# Patient Record
Sex: Male | Born: 1970
Health system: Southern US, Community
[De-identification: ages and names within clinical notes are randomized; demographics above are authoritative.]

## PROBLEM LIST (undated history)

## (undated) DIAGNOSIS — E785 Hyperlipidemia, unspecified: Secondary | ICD-10-CM

## (undated) DIAGNOSIS — I10 Essential (primary) hypertension: Secondary | ICD-10-CM

## (undated) DIAGNOSIS — N189 Chronic kidney disease, unspecified: Secondary | ICD-10-CM

## (undated) DIAGNOSIS — I499 Cardiac arrhythmia, unspecified: Secondary | ICD-10-CM

## (undated) HISTORY — DX: Hyperlipidemia, unspecified: E78.5

## (undated) HISTORY — DX: Chronic kidney disease, unspecified: N18.9

## (undated) HISTORY — DX: Cardiac arrhythmia, unspecified: I49.9

## (undated) HISTORY — DX: Essential (primary) hypertension: I10

## (undated) HISTORY — PX: RETINAL DETACHMENT SURGERY: SHX105

---

## 2012-11-09 ENCOUNTER — Encounter: Payer: Self-pay | Admitting: Family Medicine

## 2012-11-09 ENCOUNTER — Ambulatory Visit (INDEPENDENT_AMBULATORY_CARE_PROVIDER_SITE_OTHER): Payer: BC Managed Care – PPO | Admitting: Family Medicine

## 2012-11-09 VITALS — BP 156/96 | HR 59 | Temp 98.7°F | Ht 74.0 in | Wt 326.7 lb

## 2012-11-09 DIAGNOSIS — K645 Perianal venous thrombosis: Secondary | ICD-10-CM

## 2012-11-09 DIAGNOSIS — K649 Unspecified hemorrhoids: Secondary | ICD-10-CM

## 2012-11-09 MED ORDER — HYDROCORTISONE ACETATE 25 MG RE SUPP: 25.0000 mg | Freq: Two times a day (BID) | RECTAL | Status: DC

## 2012-11-09 NOTE — Progress Notes (Signed)
  Subjective:    Patient ID: Ian Stewart, male    DOB: 14-Feb-1971, 42 y.o.   MRN: [id number]  HPI This 42 y.o. male presents for evaluation of severe rectal pain starting yesterday. Difficulty sleeping last night.  He has hx of hemorrhoids and states he usually takes Preparation H and then is able to reduce the hemorrhoid but not this one and it has grown Large.   Review of Systems C/o rectal pain. No chest pain, SOB, HA, dizziness, vision change, N/V, diarrhea, constipation, dysuria, urinary urgency or frequency, myalgias, arthralgias or rash.     Objective:   Physical Exam  Vital signs noted  Well developed well nourished male.  HEENT - Head atraumatic Normocephalic Respiratory - Lungs CTA bilateral Cardiac - RRR S1 and S2 without murmur Rectal - Large thrombosed external hemorrhoid.  Procedure - Hemorrhoid cleansed with betadine and anesthesia with lidocaine 1% with small wheal And then a small horizontal incision is drained and approx. 10 cc's of clot is expressed and patient Has immediate relief.      Assessment & Plan:  Hemorrhoids - Plan: hydrocortisone (ANUSOL-HC) 25 MG suppository, Ambulatory referral to General Surgery.  Incision and drainage of thrombosed hemorrhoid.  Discussed doing Sitz baths bid For next few days.  Thrombosed external hemorrhoid - Plan: hydrocortisone (ANUSOL-HC) 25 MG suppository, Ambulatory referral to General Surgery

## 2012-11-09 NOTE — Patient Instructions (Signed)
Hemorrhoids Hemorrhoids are swollen veins around the rectum or anus. There are two types of hemorrhoids:   Internal hemorrhoids. These occur in the veins just inside the rectum. They may poke through to the outside and become irritated and painful.  External hemorrhoids. These occur in the veins outside the anus and can be felt as a painful swelling or hard lump near the anus. CAUSES  Pregnancy.   Obesity.   Constipation or diarrhea.   Straining to have a bowel movement.   Sitting for long periods on the toilet.  Heavy lifting or other activity that caused you to strain.  Anal intercourse. SYMPTOMS   Pain.   Anal itching or irritation.   Rectal bleeding.   Fecal leakage.   Anal swelling.   One or more lumps around the anus.  DIAGNOSIS  Your caregiver may be able to diagnose hemorrhoids by visual examination. Other examinations or tests that may be performed include:   Examination of the rectal area with a gloved hand (digital rectal exam).   Examination of anal canal using a small tube (scope).   A blood test if you have lost a significant amount of blood.  A test to look inside the colon (sigmoidoscopy or colonoscopy). TREATMENT Most hemorrhoids can be treated at home. However, if symptoms do not seem to be getting better or if you have a lot of rectal bleeding, your caregiver may perform a procedure to help make the hemorrhoids get smaller or remove them completely. Possible treatments include:   Placing a rubber band at the base of the hemorrhoid to cut off the circulation (rubber band ligation).   Injecting a chemical to shrink the hemorrhoid (sclerotherapy).   Using a tool to burn the hemorrhoid (infrared light therapy).   Surgically removing the hemorrhoid (hemorrhoidectomy).   Stapling the hemorrhoid to block blood flow to the tissue (hemorrhoid stapling).  HOME CARE INSTRUCTIONS   Eat foods with fiber, such as whole grains, beans,  nuts, fruits, and vegetables. Ask your doctor about taking products with added fiber in them (fibersupplements).  Increase fluid intake. Drink enough water and fluids to keep your urine clear or pale yellow.   Exercise regularly.   Go to the bathroom when you have the urge to have a bowel movement. Do not wait.   Avoid straining to have bowel movements.   Keep the anal area dry and clean. Use wet toilet paper or moist towelettes after a bowel movement.   Medicated creams and suppositories may be used or applied as directed.   Only take over-the-counter or prescription medicines as directed by your caregiver.   Take warm sitz baths for 15 20 minutes, 3 4 times a day to ease pain and discomfort.   Place ice packs on the hemorrhoids if they are tender and swollen. Using ice packs between sitz baths may be helpful.   Put ice in a plastic bag.   Place a towel between your skin and the bag.   Leave the ice on for 15 20 minutes, 3 4 times a day.   Do not use a donut-shaped pillow or sit on the toilet for long periods. This increases blood pooling and pain.  SEEK MEDICAL CARE IF:  You have increasing pain and swelling that is not controlled by treatment or medicine.  You have uncontrolled bleeding.  You have difficulty or you are unable to have a bowel movement.  You have pain or inflammation outside the area of the hemorrhoids. MAKE SURE YOU:    Understand these instructions.  Will watch your condition.  Will get help right away if you are not doing well or get worse. Document Released: 02/22/2000 Document Revised: 02/11/2012 Document Reviewed: 12/30/2011 Exit[employer] Patient Information 2014 [employer].  

## 2012-11-10 ENCOUNTER — Telehealth: Payer: Self-pay | Admitting: Family Medicine

## 2012-11-11 NOTE — Telephone Encounter (Signed)
Patient aware used suppositories and it has made bp go up so he has stopped but the bleeding is getting better

## 2012-11-11 NOTE — Telephone Encounter (Signed)
Bleeding is going to happen for next few days and is ok since he has thrombosed hemorrhoid and continue sitz bath an follow up with surgery

## 2012-11-12 NOTE — Telephone Encounter (Signed)
It is normal to have bleeding for a few days after having the clots removed from thrombosed hemorrhoid

## 2012-11-22 ENCOUNTER — Ambulatory Visit (INDEPENDENT_AMBULATORY_CARE_PROVIDER_SITE_OTHER): Payer: BC Managed Care – PPO | Admitting: Surgery

## 2013-12-15 ENCOUNTER — Ambulatory Visit (INDEPENDENT_AMBULATORY_CARE_PROVIDER_SITE_OTHER): Payer: BC Managed Care – PPO | Admitting: Nurse Practitioner

## 2013-12-15 ENCOUNTER — Encounter: Payer: Self-pay | Admitting: Nurse Practitioner

## 2013-12-15 VITALS — BP 158/105 | HR 55 | Temp 98.0°F | Ht 74.0 in | Wt 253.0 lb

## 2013-12-15 DIAGNOSIS — I10 Essential (primary) hypertension: Secondary | ICD-10-CM

## 2013-12-15 DIAGNOSIS — Z6832 Body mass index (BMI) 32.0-32.9, adult: Secondary | ICD-10-CM

## 2013-12-15 DIAGNOSIS — E785 Hyperlipidemia, unspecified: Secondary | ICD-10-CM

## 2013-12-15 MED ORDER — NEBIVOLOL HCL 10 MG PO TABS: 10.0000 mg | ORAL_TABLET | Freq: Every day | ORAL | Status: DC

## 2013-12-15 NOTE — Patient Instructions (Signed)

## 2013-12-15 NOTE — Progress Notes (Signed)
Subjective:    Patient ID: Ian Stewart, male    DOB: 1970-11-05, 43 y.o.   MRN: [id number]  Patient here today for follow up of chronic medical problems. He  Had a migraine over the weekend and had to go to ER and blood pressure was really high- He stopped taking his bystolic because he had lost a lot of weight and was told to stop taking- Blood pressure has been good up until the last several weeks   Hypertension This is a chronic problem. The current episode started more than 1 year ago. The problem is unchanged. The problem is controlled. Pertinent negatives include no blurred vision, headaches, palpitations, peripheral edema or shortness of breath. Risk factors for coronary artery disease include stress and dyslipidemia. Past treatments include angiotensin blockers, calcium channel blockers and diuretics. The current treatment provides mild improvement. Compliance problems include diet and exercise.   Hyperlipidemia This is a chronic problem. The current episode started more than 1 year ago. The problem is uncontrolled. Recent lipid tests were reviewed and are high. He has no history of diabetes, hypothyroidism or obesity. There are no known factors aggravating his hyperlipidemia. Pertinent negatives include no shortness of breath. He is currently on no antihyperlipidemic treatment (was taken off medds because he had lost weight nad labs were good.). The current treatment provides moderate improvement of lipids. Compliance problems include adherence to diet and adherence to exercise.  Risk factors for coronary artery disease include dyslipidemia and hypertension.    * He had labs drawn at work yesterday  Review of Systems  Eyes: Negative for blurred vision.  Respiratory: Negative for shortness of breath.   Cardiovascular: Negative for palpitations.  Neurological: Negative for headaches.       Objective:   Physical Exam  Constitutional: He is oriented to person, place, and time. He  appears well-developed and well-nourished.  HENT:  Head: Normocephalic.  Right Ear: External ear normal.  Left Ear: External ear normal.  Nose: Nose normal.  Mouth/Throat: Oropharynx is clear and moist.  Eyes: EOM are normal. Pupils are equal, round, and reactive to light.  Neck: Normal range of motion. Neck supple. No JVD present. No thyromegaly present.  Cardiovascular: Normal rate, regular rhythm, normal heart sounds and intact distal pulses.  Exam reveals no gallop and no friction rub.   No murmur heard. Pulmonary/Chest: Effort normal and breath sounds normal. No respiratory distress. He has no wheezes. He has no rales. He exhibits no tenderness.  Abdominal: Soft. Bowel sounds are normal. He exhibits no mass. There is no tenderness.  Musculoskeletal: Normal range of motion. He exhibits no edema.  Lymphadenopathy:    He has no cervical adenopathy.  Neurological: He is alert and oriented to person, place, and time. No cranial nerve deficit.  Skin: Skin is warm and dry.  Psychiatric: He has a normal mood and affect. His behavior is normal. Judgment and thought content normal.   BP 158/105  Pulse 55  Temp(Src) 98 F (36.7 C) (Oral)  Ht 6\' 2"  (1.88 m)  Wt 253 lb (114.76 kg)  BMI 32.47 kg/m2        Assessment & Plan:  1. Essential hypertension Avoid salt in diet Add 1/2 bystolicback to meds Keep diary of blood pressure - nebivolol (BYSTOLIC) 10 MG tablet; Take 1 tablet (10 mg total) by mouth daily.  Dispense: 90 tablet; Refill: 1  2. Hyperlipidemia with target LDL less than 100 Continue low fat diet and exercise  3. BMI 32.0-32.9 Discussed  diet and exercise for person with BMI >25 Will recheck weight in 3-6 months   Labs pending- patient to have copy of blood work sent to office. Health maintenance reviewed Continue all meds Follow up  In 3 months    [place], FNP

## 2013-12-16 ENCOUNTER — Telehealth: Payer: Self-pay | Admitting: Nurse Practitioner

## 2013-12-16 NOTE — Telephone Encounter (Signed)
Left message with this information and to call back if he has further questions or concerns.

## 2013-12-16 NOTE — Telephone Encounter (Signed)
Patient thought you mentioned hearing a slight murmur on his recent exam and after talking with a friend he wanted to know if he should follow up on this.

## 2013-12-16 NOTE — Telephone Encounter (Signed)
Very faint and nothing to worry about

## 2014-03-08 ENCOUNTER — Ambulatory Visit (INDEPENDENT_AMBULATORY_CARE_PROVIDER_SITE_OTHER): Payer: BC Managed Care – PPO | Admitting: Physician Assistant

## 2014-03-08 ENCOUNTER — Encounter: Payer: Self-pay | Admitting: Physician Assistant

## 2014-03-08 VITALS — BP 168/104 | HR 53 | Temp 98.4°F | Ht 74.0 in | Wt 266.0 lb

## 2014-03-08 DIAGNOSIS — I10 Essential (primary) hypertension: Secondary | ICD-10-CM

## 2014-03-08 NOTE — Progress Notes (Signed)
Subjective:     Patient ID: Ian Stewart, male   DOB: [birth year], 43 y.o.   MRN: [id number]  HPI Pt with general malaise/joint pain and fatigue Had some sl diarrhea today He has been under a lot of stress since Xmas His mother came to town for the holiday and ended having a MI with stenting He has just gotten her set up back at home when the sx started Some of the sx include cramping  Review of Systems  Constitutional: Positive for fever, chills, activity change, appetite change and fatigue.  HENT: Negative for congestion, postnasal drip, rhinorrhea, sinus pressure, sneezing, sore throat and tinnitus.   Gastrointestinal: Positive for diarrhea. Negative for nausea, vomiting and abdominal pain.  Musculoskeletal: Positive for myalgias and arthralgias.       Objective:   Physical Exam  Constitutional: He appears well-developed and well-nourished.  HENT:  Right Ear: External ear normal.  Left Ear: External ear normal.  Mouth/Throat: Oropharynx is clear and moist. No oropharyngeal exudate.  Neck: Neck supple. No JVD present.  Cardiovascular: Normal rate, regular rhythm and normal heart sounds.   Pulmonary/Chest: Effort normal and breath sounds normal.  Abdominal: Soft. Bowel sounds are normal. He exhibits no distension and no mass. There is no tenderness. There is no rebound and no guarding.  Musculoskeletal:  No lower ext edema + varicosities bilat  Nursing note and vitals reviewed.      Assessment:     General malaise    Plan:     Discussed with the pt i think most of the sx are coming from a viral illness Since he is having some cramping and on a diuretic will check a BMP Rest Fluids Bland diet F/U prn/pending labs

## 2014-03-08 NOTE — Patient Instructions (Signed)

## 2014-03-09 ENCOUNTER — Telehealth: Payer: Self-pay | Admitting: Physician Assistant

## 2014-03-09 LAB — BMP8+EGFR
BUN/Creatinine Ratio: 20 (ref 9–20)
BUN: 14 mg/dL (ref 6–24)
CALCIUM: 9.7 mg/dL (ref 8.7–10.2)
CO2: 26 mmol/L (ref 18–29)
CREATININE: 0.7 mg/dL — AB (ref 0.76–1.27)
Chloride: 98 mmol/L (ref 97–108)
GFR, EST AFRICAN AMERICAN: 132 mL/min/{1.73_m2} (ref >59)
GFR, EST NON AFRICAN AMERICAN: 114 mL/min/{1.73_m2} (ref >59)
GLUCOSE: 90 mg/dL (ref 65–99)
Potassium: 3.5 mmol/L (ref 3.5–5.2)
Sodium: 140 mmol/L (ref 134–144)

## 2014-03-09 NOTE — Telephone Encounter (Signed)
Aware, labs back but provider has not reviewed.

## 2014-03-20 ENCOUNTER — Other Ambulatory Visit: Payer: Self-pay | Admitting: *Deleted

## 2014-03-20 ENCOUNTER — Encounter: Payer: Self-pay | Admitting: Nurse Practitioner

## 2014-03-20 ENCOUNTER — Ambulatory Visit (INDEPENDENT_AMBULATORY_CARE_PROVIDER_SITE_OTHER): Payer: BLUE CROSS/BLUE SHIELD | Admitting: Nurse Practitioner

## 2014-03-20 VITALS — BP 158/107 | HR 56 | Temp 97.7°F | Ht 74.0 in | Wt 272.0 lb

## 2014-03-20 DIAGNOSIS — I10 Essential (primary) hypertension: Secondary | ICD-10-CM

## 2014-03-20 DIAGNOSIS — R001 Bradycardia, unspecified: Secondary | ICD-10-CM

## 2014-03-20 MED ORDER — AMLODIPINE BESYLATE 10 MG PO TABS: 10.0000 mg | ORAL_TABLET | Freq: Every day | ORAL | Status: DC

## 2014-03-20 NOTE — Patient Instructions (Signed)
Bradycardia °Bradycardia is a term for a heart rate (pulse) that, in adults, is slower than 60 beats per minute. A normal rate is 60 to 100 beats per minute. A heart rate below 60 beats per minute may be normal for some adults with healthy hearts. If the rate is too slow, the heart may have trouble pumping the volume of blood the body needs. If the heart rate gets too low, blood flow to the brain may be decreased and may make you feel lightheaded, dizzy, or faint. °The heart has a natural pacemaker in the top of the heart called the SA node (sinoatrial or sinus node). This pacemaker sends out regular electrical signals to the muscle of the heart, telling the heart muscle when to beat (contract). The electrical signal travels from the upper parts of the heart (atria) through the AV node (atrioventricular node), to the lower chambers of the heart (ventricles). The ventricles squeeze, pumping the blood from your heart to your lungs and to the rest of your body. °CAUSES  °· Problem with the heart's electrical system. °· Problem with the heart's natural pacemaker. °· Heart disease, damage, or infection. °· Medications. °· Problems with minerals and salts (electrolytes). °SYMPTOMS  °· Fainting (syncope). °· Fatigue and weakness. °· Shortness of breath (dyspnea). °· Chest pain (angina). °· Drowsiness. °· Confusion. °DIAGNOSIS  °· An electrocardiogram (ECG) can help your caregiver determine the type of slow heart rate you have. °· If the cause is not seen on an ECG, you may need to wear a heart monitor that records your heart rhythm for several hours or days. °· Blood tests. °TREATMENT  °· Electrolyte supplements. °· Medications. °· Withholding medication which is causing a slow heart rate. °· Pacemaker placement. °SEEK IMMEDIATE MEDICAL CARE IF:  °· You feel lightheaded or faint. °· You develop an irregular heart rate. °· You feel chest pain or have trouble breathing. °MAKE SURE YOU:  °· Understand these  instructions. °· Will watch your condition. °· Will get help right away if you are not doing well or get worse. °Document Released: 11/16/2001 Document Revised: 05/19/2011 Document Reviewed: 06/01/2013 °Exit[employer]® Patient Information ©2015 [employer]. This information is not intended to replace advice given to you by your health care provider. Make sure you discuss any questions you have with your health care provider. ° °

## 2014-03-20 NOTE — Progress Notes (Signed)
   Subjective:    Patient ID: Ian Stewart, male    DOB: [birth year], 44 y.o.   MRN: [id number]  Patient here today for follow up of chronic medical problems. He saw B. Webster 2 weeks ago c/o fatigue and achiness- urine was clear and labs normal- Went to ER last week with same complaints and they did EKG which was negative . He is feeling no better. Patient says that his heart rate is dropping into the low 40's at night.  Hypertension This is a chronic problem. The current episode started more than 1 year ago. The problem is uncontrolled. Pertinent negatives include no headaches, palpitations or shortness of breath. Risk factors for coronary artery disease include dyslipidemia, family history, male gender and obesity. Past treatments include beta blockers, angiotensin blockers, diuretics and calcium channel blockers. Compliance problems include diet.   Hyperlipidemia This is a chronic problem. The problem is uncontrolled. Recent lipid tests were reviewed and are variable. Pertinent negatives include no shortness of breath. The current treatment provides moderate improvement of lipids. Compliance problems include adherence to diet and adherence to exercise.  Risk factors for coronary artery disease include dyslipidemia, hypertension, male sex and obesity.      Review of Systems  Constitutional: Positive for appetite change and fatigue. Negative for fever and unexpected weight change.  HENT: Negative.   Respiratory: Negative for shortness of breath.   Cardiovascular: Negative for palpitations.  Genitourinary: Negative.   Neurological: Negative.  Negative for headaches.  Psychiatric/Behavioral: Negative.   All other systems reviewed and are negative.      Objective:   Physical Exam  Constitutional: He is oriented to person, place, and time. He appears well-developed and well-nourished.  HENT:  Head: Normocephalic.  Right Ear: External ear normal.  Left Ear: External ear normal.  Nose:  Nose normal.  Mouth/Throat: Oropharynx is clear and moist.  Eyes: EOM are normal. Pupils are equal, round, and reactive to light.  Neck: Normal range of motion. Neck supple. No JVD present. No thyromegaly present.  Cardiovascular: Normal rate, regular rhythm, normal heart sounds and intact distal pulses.  Exam reveals no gallop and no friction rub.   No murmur heard. Pulmonary/Chest: Effort normal and breath sounds normal. No respiratory distress. He has no wheezes. He has no rales. He exhibits no tenderness.  Abdominal: Soft. Bowel sounds are normal. He exhibits no mass. There is no tenderness.  Genitourinary: Prostate normal and penis normal.  Musculoskeletal: Normal range of motion. He exhibits no edema.  Lymphadenopathy:    He has no cervical adenopathy.  Neurological: He is alert and oriented to person, place, and time. No cranial nerve deficit.  Skin: Skin is warm and dry.  Psychiatric: He has a normal mood and affect. His behavior is normal. Judgment and thought content normal.    BP 158/107 mmHg  Pulse 56  Temp(Src) 97.7 F (36.5 C) (Oral)  Ht 6\' 2"  (1.88 m)  Wt 272 lb (123.378 kg)  BMI 34.91 kg/m2        Assessment & Plan:  1. Essential hypertension Do not add salt to diet Continue all other meds - amLODipine (NORVASC) 10 MG tablet; Take 1 tablet (10 mg total) by mouth daily.  Dispense: 30 tablet; Refill: 3  2. Bradycardia with 41 - 50 beats per minute referral to cardiologist - Holter monitor - 24 hour; Future   [name] Done, FNP

## 2014-03-29 ENCOUNTER — Telehealth: Payer: Self-pay | Admitting: Nurse Practitioner

## 2014-03-29 NOTE — Telephone Encounter (Signed)
Please review Holter and advise

## 2014-03-30 ENCOUNTER — Other Ambulatory Visit: Payer: Self-pay | Admitting: *Deleted

## 2014-03-30 DIAGNOSIS — R001 Bradycardia, unspecified: Secondary | ICD-10-CM

## 2014-03-30 NOTE — Telephone Encounter (Signed)
Pt aware and has appt scheduled with cardiology, results faxed to pt.

## 2014-03-30 NOTE — Telephone Encounter (Signed)
holter monitor was reviewed earlier this week and he was having significant bradycardia during the night- referral was suppose  To be made to cartdiology- I will reco referral if  Not done- ok for copies of labs

## 2014-04-05 ENCOUNTER — Ambulatory Visit (INDEPENDENT_AMBULATORY_CARE_PROVIDER_SITE_OTHER): Payer: BLUE CROSS/BLUE SHIELD | Admitting: Cardiology

## 2014-04-05 ENCOUNTER — Encounter: Payer: Self-pay | Admitting: Cardiology

## 2014-04-05 VITALS — BP 150/118 | HR 57 | Ht 73.0 in | Wt 274.0 lb

## 2014-04-05 DIAGNOSIS — I10 Essential (primary) hypertension: Secondary | ICD-10-CM

## 2014-04-05 DIAGNOSIS — R0683 Snoring: Secondary | ICD-10-CM

## 2014-04-05 NOTE — Progress Notes (Signed)
HPI The patient presents as a new patient for evaluation of bradycardia. He has no past cardiac history. He has had morbid obesity but he's had wonderful success losing weight over the past year or so although he's gained some of this back. He's now trying to get back to the exercise and diet to help with his weight loss. He's wearing a Fitbit.  This demonstrates that his heart rate is sometimes in the 40s particularly at night. During the day he's not having any sustained bradycardias, presyncope or syncope. He does at times feel tired area he has difficult to control hypertension. He does have snoring and daytime somnolence. He has occasional headaches. He has a strong family history of heart disease but himself does not diagnosis of this. He does not describe chest pressure, neck or arm discomfort. He doesn't have edema.    Allergies  Allergen Reactions  . Lipitor [Atorvastatin] Other (See Comments)    Muscle aches    Current Outpatient Prescriptions  Medication Sig Dispense Refill  . amLODipine (NORVASC) 10 MG tablet Take 1 tablet (10 mg total) by mouth daily. 30 tablet 3  . amoxicillin (AMOXIL) 500 MG capsule Take 500 mg by mouth 2 (two) times daily.     Marland Kitchen aspirin 81 MG tablet Take 81 mg by mouth daily.    . cholecalciferol (VITAMIN D) 1000 UNITS tablet Take 5,000 Units by mouth daily.     . Multiple Vitamin (MULTIVITAMIN) capsule Take 1 capsule by mouth daily.    . nebivolol (BYSTOLIC) 10 MG tablet Take 1 tablet (10 mg total) by mouth daily. (Patient taking differently: Take 5 mg by mouth. ) 90 tablet 1  . valsartan-hydrochlorothiazide (DIOVAN-HCT) 320-25 MG per tablet Take 1 tablet by mouth daily.     No current facility-administered medications for this visit.    Past Medical History  Diagnosis Date  . Hypertension   . Hyperlipidemia   . Arrhythmia     BRADYCARDIA/ 41-50 BEATS    No past surgical history on file.  No family history on file.  History   Social History    . Marital Status: Single    Spouse Name: N/A    Number of Children: N/A  . Years of Education: N/A   Occupational History  . Not on file.   Social History Main Topics  . Smoking status: Never Smoker   . Smokeless tobacco: Not on file  . Alcohol Use: No  . Drug Use: No  . Sexual Activity: Not on file   Other Topics Concern  . Not on file   Social History Narrative    ROS:  As stated in the HPI and negative for all other systems.   PHYSICAL EXAM BP 150/118 mmHg  Pulse 57  Ht 6\' 1"  (1.854 m)  Wt 274 lb (124.286 kg)  BMI 36.16 kg/m2  GENERAL:  Well appearing HEENT:  Pupils equal round and reactive, fundi not visualized, oral mucosa unremarkable NECK:  No jugular venous distention, waveform within normal limits, carotid upstroke brisk and symmetric, no bruits, no thyromegaly LYMPHATICS:  No cervical, inguinal adenopathy LUNGS:  Clear to auscultation bilaterally BACK:  No CVA tenderness CHEST:  Unremarkable HEART:  PMI not displaced or sustained,S1 and S2 within normal limits, no S3, no S4, no clicks, no rubs, no murmurs ABD:  Flat, positive bowel sounds normal in frequency in pitch, no bruits, no rebound, no guarding, no midline pulsatile mass, no hepatomegaly, no splenomegaly, obese EXT:  2 plus pulses throughout,  no edema, no cyanosis no clubbing SKIN:  No rashes no nodules NEURO:  Cranial nerves II through XII grossly intact, motor grossly intact throughout PSYCH:  Cognitively intact, oriented to person place and time   EKG:  Sinus rhythm, rate 57, axis within normal limits, intervals within normal limits, nonspecific ST-T wave flattening. 04/05/2014   ASSESSMENT AND PLAN  BRADYCARDIA:  I don't think he's particularly symptomatic with this. No specific therapy is indicated. He does note that he is on a beta blocker which will continue. No change in therapy is planned at this point. No monitoring is indicated.   HTN:  His blood pressure is somewhat difficult to  control but was controlled when his weight was down about 25 pounds more than now. We will going to work on weight loss and diagnosis of sleep apnea and then consider further titration of his medications.   OVERWEIGHT:  We discussed this and he will get back to weight loss. I applauded his success so far.   SNORING:  The patient almost definitely has sleep apnea contributing to bradycardia and hypertension. I will send him for a sleep study.  RISK REDUCTION:  Once his blood pressure is somewhat improved I would like to bring him back for a POET (Plain Old Exercise Treadmill) given his family history.

## 2014-04-05 NOTE — Patient Instructions (Signed)
The current medical regimen is effective;  continue present plan and medications.  Your physician has recommended that you have a sleep study. This test records several body functions during sleep, including: brain activity, eye movement, oxygen and carbon dioxide blood levels, heart rate and rhythm, breathing rate and rhythm, the flow of air through your mouth and nose, snoring, body muscle movements, and chest and belly movement.  You will be called about scheduling this testing.  If you do not receive a call to schedule, please call our office.  Follow up in 6 months with Dr. Percival Spanish in [place].  You will receive a letter in the mail 2 months before you are due.  Please call us when you receive this letter to schedule your follow up appointment.  Thank you for choosing [facility]!!

## 2014-04-11 ENCOUNTER — Ambulatory Visit: Payer: BLUE CROSS/BLUE SHIELD | Attending: Cardiology | Admitting: Sleep Medicine

## 2014-04-11 DIAGNOSIS — R0683 Snoring: Secondary | ICD-10-CM | POA: Diagnosis not present

## 2014-04-11 DIAGNOSIS — R451 Restlessness and agitation: Secondary | ICD-10-CM | POA: Diagnosis not present

## 2014-04-11 DIAGNOSIS — R5383 Other fatigue: Secondary | ICD-10-CM | POA: Diagnosis not present

## 2014-04-14 NOTE — Sleep Study (Signed)
  [employer] A. Merlene Laughter, MD     [email or web address]        NOCTURNAL POLYSOMNOGRAM    LOCATION: SLEEP LAB FACILITY: [facility]   PHYSICIAN: Tiyana Galla A. Merlene Laughter, M.D.   DATE OF STUDY: 04/11/2014.   REFERRING PHYSICIAN: Minus Breeding.   INDICATIONS: The patient is a 43 year old male who presents with snoring, fatigue, body jerks and restlessness while sleeping.  MEDICATIONS:  Prior to Admission medications   Medication Sig Start Date End Date Taking? Authorizing Provider  amLODipine (NORVASC) 10 MG tablet Take 1 tablet (10 mg total) by mouth daily. 03/20/14   [name] Done, FNP  amoxicillin (AMOXIL) 500 MG capsule Take 500 mg by mouth 2 (two) times daily.  03/29/14   Historical Provider, MD  aspirin 81 MG tablet Take 81 mg by mouth daily.    Historical Provider, MD  cholecalciferol (VITAMIN D) 1000 UNITS tablet Take 5,000 Units by mouth daily.     Historical Provider, MD  Multiple Vitamin (MULTIVITAMIN) capsule Take 1 capsule by mouth daily.    Historical Provider, MD  nebivolol (BYSTOLIC) 10 MG tablet Take 1 tablet (10 mg total) by mouth daily. Patient taking differently: Take 5 mg by mouth.  12/15/13   [name] Done, FNP  valsartan-hydrochlorothiazide (DIOVAN-HCT) 320-25 MG per tablet Take 1 tablet by mouth daily.    Historical Provider, MD      EPWORTH SLEEPINESS SCALE: 9.   BMI: 36.   ARCHITECTURAL SUMMARY: Total recording time was 441 minutes. Sleep efficiency 54 %. Sleep latency 45 minutes. REM latency 99 minutes. Stage NI 16 %, N2 70 % and N3 0.5 % and REM sleep 13 %.    RESPIRATORY DATA:  Baseline oxygen saturation is 95 %. The lowest saturation is 90 %. The diagnostic AHI is 1. The RDI is 1. The REM AHI is 2.  LIMB MOVEMENT SUMMARY: PLM index 0.   ELECTROCARDIOGRAM: Average heart rate is 63 with no significant dysrhythmias observed.   IMPRESSION:  1. Abnormal sleep architecture with a moderately reduced sleep efficiency without  polysomnographic etiologies.  Thanks for this referral.  Sandie Swayze A. Merlene Laughter, M.D. Diplomat, Tax adviser of Sleep Medicine.

## 2014-04-19 ENCOUNTER — Institutional Professional Consult (permissible substitution): Payer: Self-pay | Admitting: Cardiology

## 2014-04-28 ENCOUNTER — Encounter: Payer: Self-pay | Admitting: Cardiology

## 2014-07-20 ENCOUNTER — Ambulatory Visit (INDEPENDENT_AMBULATORY_CARE_PROVIDER_SITE_OTHER): Payer: BLUE CROSS/BLUE SHIELD | Admitting: Nurse Practitioner

## 2014-07-20 ENCOUNTER — Encounter: Payer: Self-pay | Admitting: Nurse Practitioner

## 2014-07-20 VITALS — BP 134/86 | HR 59 | Temp 97.9°F | Ht 73.0 in | Wt 305.6 lb

## 2014-07-20 DIAGNOSIS — I10 Essential (primary) hypertension: Secondary | ICD-10-CM | POA: Diagnosis not present

## 2014-07-20 DIAGNOSIS — E785 Hyperlipidemia, unspecified: Secondary | ICD-10-CM

## 2014-07-20 MED ORDER — AMLODIPINE BESYLATE 10 MG PO TABS: 10.0000 mg | ORAL_TABLET | Freq: Every day | ORAL | Status: DC

## 2014-07-20 MED ORDER — NEBIVOLOL HCL 10 MG PO TABS: 5.0000 mg | ORAL_TABLET | Freq: Every day | ORAL | Status: DC

## 2014-07-20 MED ORDER — VALSARTAN-HYDROCHLOROTHIAZIDE 320-25 MG PO TABS: 1.0000 | ORAL_TABLET | Freq: Every day | ORAL | Status: DC

## 2014-07-20 NOTE — Patient Instructions (Signed)
Fat and Cholesterol Control Diet Fat and cholesterol levels in your blood and organs are influenced by your diet. High levels of fat and cholesterol may lead to diseases of the heart, small and large blood vessels, gallbladder, liver, and pancreas. CONTROLLING FAT AND CHOLESTEROL WITH DIET Although exercise and lifestyle factors are important, your diet is key. That is because certain foods are known to raise cholesterol and others to lower it. The goal is to balance foods for their effect on cholesterol and more importantly, to replace saturated and trans fat with other types of fat, such as monounsaturated fat, polyunsaturated fat, and omega-3 fatty acids. On average, a person should consume no more than 15 to 17 g of saturated fat daily. Saturated and trans fats are considered "bad" fats, and they will raise LDL cholesterol. Saturated fats are primarily found in animal products such as meats, butter, and cream. However, that does not mean you need to give up all your favorite foods. Today, there are good tasting, low-fat, low-cholesterol substitutes for most of the things you like to eat. Choose low-fat or nonfat alternatives. Choose round or loin cuts of red meat. These types of cuts are lowest in fat and cholesterol. Chicken (without the skin), fish, veal, and ground turkey breast are great choices. Eliminate fatty meats, such as hot dogs and salami. Even shellfish have little or no saturated fat. Have a 3 oz (85 g) portion when you eat lean meat, poultry, or fish. Trans fats are also called "partially hydrogenated oils." They are oils that have been scientifically manipulated so that they are solid at room temperature resulting in a longer shelf life and improved taste and texture of foods in which they are added. Trans fats are found in stick margarine, some tub margarines, cookies, crackers, and baked goods.  When baking and cooking, oils are a great substitute for butter. The monounsaturated oils are  especially beneficial since it is believed they lower LDL and raise HDL. The oils you should avoid entirely are saturated tropical oils, such as coconut and palm.  Remember to eat a lot from food groups that are naturally free of saturated and trans fat, including fish, fruit, vegetables, beans, grains (barley, rice, couscous, bulgur wheat), and pasta (without cream sauces).  IDENTIFYING FOODS THAT LOWER FAT AND CHOLESTEROL  Soluble fiber may lower your cholesterol. This type of fiber is found in fruits such as apples, vegetables such as broccoli, potatoes, and carrots, legumes such as beans, peas, and lentils, and grains such as barley. Foods fortified with plant sterols (phytosterol) may also lower cholesterol. You should eat at least 2 g per day of these foods for a cholesterol lowering effect.  Read package labels to identify low-saturated fats, trans fat free, and low-fat foods at the supermarket. Select cheeses that have only 2 to 3 g saturated fat per ounce. Use a heart-healthy tub margarine that is free of trans fats or partially hydrogenated oil. When buying baked goods (cookies, crackers), avoid partially hydrogenated oils. Breads and muffins should be made from whole grains (whole-wheat or whole oat flour, instead of "flour" or "enriched flour"). Buy non-creamy canned soups with reduced salt and no added fats.  FOOD PREPARATION TECHNIQUES  Never deep-fry. If you must fry, either stir-fry, which uses very little fat, or use non-stick cooking sprays. When possible, broil, bake, or roast meats, and steam vegetables. Instead of putting butter or margarine on vegetables, use lemon and herbs, applesauce, and cinnamon (for squash and sweet potatoes). Use nonfat   yogurt, salsa, and low-fat dressings for salads.  LOW-SATURATED FAT / LOW-FAT FOOD SUBSTITUTES Meats / Saturated Fat (g)  Avoid: Steak, marbled (3 oz/85 g) / 11 g  Choose: Steak, lean (3 oz/85 g) / 4 g  Avoid: Hamburger (3 oz/85 g) / 7  g  Choose: Hamburger, lean (3 oz/85 g) / 5 g  Avoid: Ham (3 oz/85 g) / 6 g  Choose: Ham, lean cut (3 oz/85 g) / 2.4 g  Avoid: Chicken, with skin, dark meat (3 oz/85 g) / 4 g  Choose: Chicken, skin removed, dark meat (3 oz/85 g) / 2 g  Avoid: Chicken, with skin, light meat (3 oz/85 g) / 2.5 g  Choose: Chicken, skin removed, light meat (3 oz/85 g) / 1 g Dairy / Saturated Fat (g)  Avoid: Whole milk (1 cup) / 5 g  Choose: Low-fat milk, 2% (1 cup) / 3 g  Choose: Low-fat milk, 1% (1 cup) / 1.5 g  Choose: Skim milk (1 cup) / 0.3 g  Avoid: Hard cheese (1 oz/28 g) / 6 g  Choose: Skim milk cheese (1 oz/28 g) / 2 to 3 g  Avoid: Cottage cheese, 4% fat (1 cup) / 6.5 g  Choose: Low-fat cottage cheese, 1% fat (1 cup) / 1.5 g  Avoid: Ice cream (1 cup) / 9 g  Choose: Sherbet (1 cup) / 2.5 g  Choose: Nonfat frozen yogurt (1 cup) / 0.3 g  Choose: Frozen fruit bar / trace  Avoid: Whipped cream (1 tbs) / 3.5 g  Choose: Nondairy whipped topping (1 tbs) / 1 g Condiments / Saturated Fat (g)  Avoid: Mayonnaise (1 tbs) / 2 g  Choose: Low-fat mayonnaise (1 tbs) / 1 g  Avoid: Butter (1 tbs) / 7 g  Choose: Extra light margarine (1 tbs) / 1 g  Avoid: Coconut oil (1 tbs) / 11.8 g  Choose: Olive oil (1 tbs) / 1.8 g  Choose: Corn oil (1 tbs) / 1.7 g  Choose: Safflower oil (1 tbs) / 1.2 g  Choose: Sunflower oil (1 tbs) / 1.4 g  Choose: Soybean oil (1 tbs) / 2.4 g  Choose: Canola oil (1 tbs) / 1 g Document Released: 02/24/2005 Document Revised: 06/21/2012 Document Reviewed: 05/25/2013 Exit[employer] Patient Information 2015 [employer]. This information is not intended to replace advice given to you by your health care provider. Make sure you discuss any questions you have with your health care provider.  

## 2014-07-20 NOTE — Progress Notes (Signed)
   Subjective:    Patient ID: Ian Stewart, male    DOB: [birth year], 44 y.o.   MRN: [id number]  HPI patient works at [employer] and had labs drawn there- cholesterol was very elevated and patient is in today to discuss. [facility] nurse practitioner started him back on simvastatin in February when labs were done. He is doing well today without complaints.  He also has a history of hypertension which is pretty well under control with diovan/Hct, bystolic and norvasc.  Review of Systems  Constitutional: Negative.   HENT: Negative.   Respiratory: Negative.   Cardiovascular: Negative.   Genitourinary: Negative.   Neurological: Negative.   Psychiatric/Behavioral: Negative.   All other systems reviewed and are negative.      Objective:   Physical Exam  Constitutional: He is oriented to person, place, and time. He appears well-developed and well-nourished.  Cardiovascular: Normal rate, regular rhythm and normal heart sounds.   Pulmonary/Chest: Effort normal and breath sounds normal.  Neurological: He is alert and oriented to person, place, and time.  Skin: Skin is warm and dry.  Psychiatric: He has a normal mood and affect. His behavior is normal. Judgment and thought content normal.    BP 134/86 mmHg  Pulse 59  Temp(Src) 97.9 F (36.6 C) (Oral)  Ht $R'6\' 1"'Sa$  (1.854 m)  Wt 305 lb 9.6 oz (138.619 kg)  BMI 40.33 kg/m2       Assessment & Plan:  1. Essential hypertension Do not add salt to diet - CMP14+EGFR - amLODipine (NORVASC) 10 MG tablet; Take 1 tablet (10 mg total) by mouth daily.  Dispense: 30 tablet; Refill: 3 - valsartan-hydrochlorothiazide (DIOVAN-HCT) 320-25 MG per tablet; Take 1 tablet by mouth daily.  Dispense: 90 tablet; Refill: 1 - nebivolol (BYSTOLIC) 10 MG tablet; Take 0.5 tablets (5 mg total) by mouth daily.  Dispense: 90 tablet; Refill: 1  2. Hyperlipidemia with target LDL less than 100 Low fat diet - NMR, lipoprofile    Labs pending Health maintenance reviewed Diet  and exercise encouraged Continue all meds Follow up  In 3 months   [place], FNP

## 2014-07-21 LAB — NMR, LIPOPROFILE
Cholesterol: 177 mg/dL (ref 100–199)
HDL Cholesterol by NMR: 37 mg/dL — ABNORMAL LOW (ref >39)
HDL PARTICLE NUMBER: 31.6 umol/L (ref >=30.5)
LDL Particle Number: 1386 nmol/L — ABNORMAL HIGH (ref <1000)
LDL SIZE: 20.3 nm (ref >20.5)
LDL-C: 87 mg/dL (ref 0–99)
LP-IR Score: 85 — ABNORMAL HIGH (ref <=45)
Small LDL Particle Number: 764 nmol/L — ABNORMAL HIGH (ref <=527)
Triglycerides by NMR: 263 mg/dL — ABNORMAL HIGH (ref 0–149)

## 2014-07-21 LAB — CMP14+EGFR
A/G RATIO: 1.9 (ref 1.1–2.5)
ALBUMIN: 4.1 g/dL (ref 3.5–5.5)
ALT: 32 IU/L (ref 0–44)
AST: 37 IU/L (ref 0–40)
Alkaline Phosphatase: 47 IU/L (ref 39–117)
BUN / CREAT RATIO: 16 (ref 9–20)
BUN: 15 mg/dL (ref 6–24)
Bilirubin Total: 0.4 mg/dL (ref 0.0–1.2)
CALCIUM: 9.5 mg/dL (ref 8.7–10.2)
CHLORIDE: 101 mmol/L (ref 97–108)
CO2: 25 mmol/L (ref 18–29)
CREATININE: 0.92 mg/dL (ref 0.76–1.27)
GFR, EST AFRICAN AMERICAN: 116 mL/min/{1.73_m2} (ref >59)
GFR, EST NON AFRICAN AMERICAN: 100 mL/min/{1.73_m2} (ref >59)
Globulin, Total: 2.2 g/dL (ref 1.5–4.5)
Glucose: 91 mg/dL (ref 65–99)
Potassium: 4.1 mmol/L (ref 3.5–5.2)
SODIUM: 144 mmol/L (ref 134–144)
TOTAL PROTEIN: 6.3 g/dL (ref 6.0–8.5)

## 2014-11-06 ENCOUNTER — Encounter: Payer: Self-pay | Admitting: Physician Assistant

## 2014-11-06 ENCOUNTER — Ambulatory Visit (INDEPENDENT_AMBULATORY_CARE_PROVIDER_SITE_OTHER): Payer: BLUE CROSS/BLUE SHIELD | Admitting: Physician Assistant

## 2014-11-06 VITALS — BP 158/104 | HR 51 | Temp 98.4°F | Ht 73.0 in | Wt 312.0 lb

## 2014-11-06 DIAGNOSIS — Z23 Encounter for immunization: Secondary | ICD-10-CM | POA: Diagnosis not present

## 2014-11-06 DIAGNOSIS — L03011 Cellulitis of right finger: Secondary | ICD-10-CM

## 2014-11-06 NOTE — Progress Notes (Signed)
   Subjective:    Patient ID: Ian Stewart, male    DOB: [birth year], 44 y.o.   MRN: [id number]  HPI 44 y/o male presents with c/o "ripped nail" on middle finger , right hand x 1 week ago after hitting a piece of equipment. He saw a PA at work, who prescribed Doxycycline 100 BID x 10 days. He states that it has improved but is still tender on the distal portion.    Review of Systems  Constitutional: Negative.   HENT: Negative.   Eyes: Negative.   Respiratory: Negative.   Cardiovascular: Negative.   Gastrointestinal: Negative.   Endocrine: Negative.   Genitourinary: Negative.   Skin:       Inflamed middle finger, right hand around nail.   Neurological: Negative.        Objective:   Physical Exam  Constitutional: He is oriented to person, place, and time.  Neurological: He is alert and oriented to person, place, and time.  Skin:  Right hand, middle finger - mild erythema on left cuticle with skin tear.   Psychiatric: He has a normal mood and affect. His behavior is normal. Judgment and thought content normal.  Nursing note and vitals reviewed.         Assessment & Plan:  1. Need for prophylactic vaccination with combined diphtheria-tetanus-pertussis (DTP) vaccine  - Td : Tetanus/diphtheria >7yo Preservative  free  2. Paronychia, right - Continue to take all of antibiotic as directed - Continue to do epsom soaks  - Cover with bandage at all times to prevent add'l infection    RTO prn   Taray Normoyle A. Benjamin Stain PA-C

## 2014-11-06 NOTE — Patient Instructions (Signed)
Continue to do epsom salt soaks Take all of antibiotic Cover with small amt of vaseline and bandaid

## 2014-12-21 ENCOUNTER — Encounter: Payer: Self-pay | Admitting: Nurse Practitioner

## 2014-12-21 ENCOUNTER — Ambulatory Visit (INDEPENDENT_AMBULATORY_CARE_PROVIDER_SITE_OTHER): Payer: BLUE CROSS/BLUE SHIELD | Admitting: Nurse Practitioner

## 2014-12-21 VITALS — BP 132/82 | HR 59 | Temp 97.5°F | Ht 73.0 in | Wt 311.0 lb

## 2014-12-21 DIAGNOSIS — I10 Essential (primary) hypertension: Secondary | ICD-10-CM | POA: Diagnosis not present

## 2014-12-21 NOTE — Patient Instructions (Signed)
Hypertension Hypertension, commonly called high blood pressure, is when the force of blood pumping through your arteries is too strong. Your arteries are the blood vessels that carry blood from your heart throughout your body. A blood pressure reading consists of a higher number over a lower number, such as 110/72. The higher number (systolic) is the pressure inside your arteries when your heart pumps. The lower number (diastolic) is the pressure inside your arteries when your heart relaxes. Ideally you want your blood pressure below 120/80. Hypertension forces your heart to work harder to pump blood. Your arteries may become narrow or stiff. Having untreated or uncontrolled hypertension can cause heart attack, stroke, kidney disease, and other problems. RISK FACTORS Some risk factors for high blood pressure are controllable. Others are not.  Risk factors you cannot control include:   Race. You may be at higher risk if you are African American.  Age. Risk increases with age.  Gender. Men are at higher risk than women before age 45 years. After age 65, women are at higher risk than men. Risk factors you can control include:  Not getting enough exercise or physical activity.  Being overweight.  Getting too much fat, sugar, calories, or salt in your diet.  Drinking too much alcohol. SIGNS AND SYMPTOMS Hypertension does not usually cause signs or symptoms. Extremely high blood pressure (hypertensive crisis) may cause headache, anxiety, shortness of breath, and nosebleed. DIAGNOSIS To check if you have hypertension, your health care provider will measure your blood pressure while you are seated, with your arm held at the level of your heart. It should be measured at least twice using the same arm. Certain conditions can cause a difference in blood pressure between your right and left arms. A blood pressure reading that is higher than normal on one occasion does not mean that you need treatment. If  it is not clear whether you have high blood pressure, you may be asked to return on a different day to have your blood pressure checked again. Or, you may be asked to monitor your blood pressure at home for 1 or more weeks. TREATMENT Treating high blood pressure includes making lifestyle changes and possibly taking medicine. Living a healthy lifestyle can help lower high blood pressure. You may need to change some of your habits. Lifestyle changes may include:  Following the DASH diet. This diet is high in fruits, vegetables, and whole grains. It is low in salt, red meat, and added sugars.  Keep your sodium intake below 2,300 mg per day.  Getting at least 30-45 minutes of aerobic exercise at least 4 times per week.  Losing weight if necessary.  Not smoking.  Limiting alcoholic beverages.  Learning ways to reduce stress. Your health care provider may prescribe medicine if lifestyle changes are not enough to get your blood pressure under control, and if one of the following is true:  You are 18-59 years of age and your systolic blood pressure is above 140.  You are 60 years of age or older, and your systolic blood pressure is above 150.  Your diastolic blood pressure is above 90.  You have diabetes, and your systolic blood pressure is over 140 or your diastolic blood pressure is over 90.  You have kidney disease and your blood pressure is above 140/90.  You have heart disease and your blood pressure is above 140/90. Your personal target blood pressure may vary depending on your medical conditions, your age, and other factors. HOME CARE INSTRUCTIONS    Have your blood pressure rechecked as directed by your health care provider.   Take medicines only as directed by your health care provider. Follow the directions carefully. Blood pressure medicines must be taken as prescribed. The medicine does not work as well when you skip doses. Skipping doses also puts you at risk for  problems.  Do not smoke.   Monitor your blood pressure at home as directed by your health care provider. SEEK MEDICAL CARE IF:   You think you are having a reaction to medicines taken.  You have recurrent headaches or feel dizzy.  You have swelling in your ankles.  You have trouble with your vision. SEEK IMMEDIATE MEDICAL CARE IF:  You develop a severe headache or confusion.  You have unusual weakness, numbness, or feel faint.  You have severe chest or abdominal pain.  You vomit repeatedly.  You have trouble breathing. MAKE SURE YOU:   Understand these instructions.  Will watch your condition.  Will get help right away if you are not doing well or get worse.   This information is not intended to replace advice given to you by your health care provider. Make sure you discuss any questions you have with your health care provider.   Document Released: 02/24/2005 Document Revised: 07/11/2014 Document Reviewed: 12/17/2012 Elsevier Interactive Patient Education 2016 Else[employer] Inc[employer]  

## 2014-12-21 NOTE — Progress Notes (Signed)
   Subjective:    Patient ID: Ian Stewart, male    DOB: [birth year], 44 y.o.   MRN: [id number]  HPI Patient was sen by [facility] Backbone on 11/06/14 for nail avulsion follow up- At that time his blood pressure was 158 104. He is here today to recheck blood pressure. He is currently on diovan/hct 320-25mg , norvacs 10mg  and bystolic 79KF  daily. He denies chest pian, SOB or headache.    Review of Systems  Constitutional: Negative.   HENT: Negative.   Respiratory: Negative.  Negative for shortness of breath.   Cardiovascular: Negative for chest pain, palpitations and leg swelling.  Genitourinary: Negative.   Neurological: Negative for headaches.  Psychiatric/Behavioral: Negative.   All other systems reviewed and are negative.      Objective:   Physical Exam  Constitutional: He is oriented to person, place, and time. He appears well-developed and well-nourished.  Cardiovascular: Normal rate, regular rhythm and normal heart sounds.   Pulmonary/Chest: Effort normal and breath sounds normal.  Neurological: He is alert and oriented to person, place, and time.  Skin: Skin is warm.  Psychiatric: He has a normal mood and affect. His behavior is normal. Judgment and thought content normal.   BP 132/82 mmHg  Pulse 59  Temp(Src) 97.5 F (36.4 C) (Oral)  Ht 6\' 1"  (1.854 m)  Wt 311 lb (141.069 kg)  BMI 41.04 kg/m2        Assessment & Plan:   1. Essential hypertension    Continue all current meds Follow up in 3 months   [place], FNP

## 2015-02-07 ENCOUNTER — Encounter: Payer: Self-pay | Admitting: Nurse Practitioner

## 2015-03-26 ENCOUNTER — Encounter: Payer: Self-pay | Admitting: Nurse Practitioner

## 2015-03-26 ENCOUNTER — Ambulatory Visit (INDEPENDENT_AMBULATORY_CARE_PROVIDER_SITE_OTHER): Payer: BLUE CROSS/BLUE SHIELD | Admitting: Nurse Practitioner

## 2015-03-26 VITALS — BP 137/92 | HR 64 | Temp 97.9°F | Ht 73.0 in | Wt 330.0 lb

## 2015-03-26 DIAGNOSIS — I10 Essential (primary) hypertension: Secondary | ICD-10-CM | POA: Diagnosis not present

## 2015-03-26 DIAGNOSIS — E785 Hyperlipidemia, unspecified: Secondary | ICD-10-CM | POA: Diagnosis not present

## 2015-03-26 MED ORDER — AMLODIPINE BESYLATE 10 MG PO TABS: 10.0000 mg | ORAL_TABLET | Freq: Every day | ORAL | Status: DC

## 2015-03-26 MED ORDER — SIMVASTATIN 20 MG PO TABS: 20.0000 mg | ORAL_TABLET | Freq: Every day | ORAL | Status: DC

## 2015-03-26 MED ORDER — NEBIVOLOL HCL 10 MG PO TABS: 5.0000 mg | ORAL_TABLET | Freq: Every day | ORAL | Status: DC

## 2015-03-26 NOTE — Patient Instructions (Signed)

## 2015-03-26 NOTE — Progress Notes (Signed)
Subjective:    Patient ID: Ian Stewart, male    DOB: [birth year], 45 y.o.   MRN: [id number]  Patient here today for follow up of chronic medical problems.  Outpatient Encounter Prescriptions as of 03/26/2015  Medication Sig  . amLODipine (NORVASC) 10 MG tablet Take 1 tablet (10 mg total) by mouth daily.  Marland Kitchen aspirin 81 MG tablet Take 81 mg by mouth daily.  . cholecalciferol (VITAMIN D) 1000 UNITS tablet Take 2,000 Units by mouth daily.   . fluticasone (FLONASE) 50 MCG/ACT nasal spray 2 sprays daily.  . Multiple Vitamin (MULTIVITAMIN) capsule Take 1 capsule by mouth daily.  . nebivolol (BYSTOLIC) 10 MG tablet Take 0.5 tablets (5 mg total) by mouth daily.  . simvastatin (ZOCOR) 20 MG tablet Take 1 tablet by mouth daily.  . valsartan-hydrochlorothiazide (DIOVAN-HCT) 320-25 MG per tablet Take 1 tablet by mouth daily.   No facility-administered encounter medications on file as of 03/26/2015.       Hypertension This is a chronic problem. The current episode started more than 1 year ago. The problem is uncontrolled. Pertinent negatives include no headaches, palpitations or shortness of breath. Risk factors for coronary artery disease include dyslipidemia, family history, male gender and obesity. Past treatments include beta blockers, angiotensin blockers, diuretics and calcium channel blockers. Compliance problems include diet.   Hyperlipidemia This is a chronic problem. The problem is uncontrolled. Recent lipid tests were reviewed and are variable. Pertinent negatives include no shortness of breath. The current treatment provides moderate improvement of lipids. Compliance problems include adherence to diet and adherence to exercise.  Risk factors for coronary artery disease include dyslipidemia, hypertension, male sex and obesity.      Review of Systems  Constitutional: Positive for appetite change and fatigue. Negative for fever and unexpected weight change.  HENT: Negative.   Respiratory:  Negative for shortness of breath.   Cardiovascular: Negative for palpitations.  Genitourinary: Negative.   Neurological: Negative.  Negative for headaches.  Psychiatric/Behavioral: Negative.   All other systems reviewed and are negative.      Objective:   Physical Exam  Constitutional: He is oriented to person, place, and time. He appears well-developed and well-nourished.  HENT:  Head: Normocephalic.  Right Ear: External ear normal.  Left Ear: External ear normal.  Nose: Nose normal.  Mouth/Throat: Oropharynx is clear and moist.  Eyes: EOM are normal. Pupils are equal, round, and reactive to light.  Neck: Normal range of motion. Neck supple. No JVD present. No thyromegaly present.  Cardiovascular: Normal rate, regular rhythm, normal heart sounds and intact distal pulses.  Exam reveals no gallop and no friction rub.   No murmur heard. Pulmonary/Chest: Effort normal and breath sounds normal. No respiratory distress. He has no wheezes. He has no rales. He exhibits no tenderness.  Abdominal: Soft. Bowel sounds are normal. He exhibits no mass. There is no tenderness.  Genitourinary: Prostate normal and penis normal.  Musculoskeletal: Normal range of motion. He exhibits no edema.  Lymphadenopathy:    He has no cervical adenopathy.  Neurological: He is alert and oriented to person, place, and time. No cranial nerve deficit.  Skin: Skin is warm and dry.  Psychiatric: He has a normal mood and affect. His behavior is normal. Judgment and thought content normal.    BP 137/92 mmHg  Pulse 64  Temp(Src) 97.9 F (36.6 C) (Oral)  Ht 6' 1" (1.854 m)  Wt 330 lb (149.687 kg)  BMI 43.55 kg/m2  Assessment & Plan:  1. Essential hypertension Do not add salt to diet - amLODipine (NORVASC) 10 MG tablet; Take 1 tablet (10 mg total) by mouth daily.  Dispense: 30 tablet; Refill: 3 - nebivolol (BYSTOLIC) 10 MG tablet; Take 0.5 tablets (5 mg total) by mouth daily.  Dispense: 90 tablet;  Refill: 1 - CMP14+EGFR  2. Hyperlipidemia with target LDL less than 100 Low fat diet - simvastatin (ZOCOR) 20 MG tablet; Take 1 tablet (20 mg total) by mouth daily.  Dispense: 90 tablet; Refill: 1 - Lipid panel  3. Morbid obesity, unspecified obesity type (HCC) Discussed diet and exercise for person with BMI >25 Will recheck weight in 3-6 months     Labs pending Health maintenance reviewed Continue all meds Follow up  In 3 months   Mary-Margaret Martin, FNP    

## 2015-03-27 LAB — CMP14+EGFR
A/G RATIO: 1.6 (ref 1.1–2.5)
ALK PHOS: 43 IU/L (ref 39–117)
ALT: 29 IU/L (ref 0–44)
AST: 35 IU/L (ref 0–40)
Albumin: 4.1 g/dL (ref 3.5–5.5)
BILIRUBIN TOTAL: 0.4 mg/dL (ref 0.0–1.2)
BUN/Creatinine Ratio: 17 (ref 9–20)
BUN: 17 mg/dL (ref 6–24)
CHLORIDE: 101 mmol/L (ref 96–106)
CO2: 25 mmol/L (ref 18–29)
Calcium: 9.4 mg/dL (ref 8.7–10.2)
Creatinine, Ser: 1 mg/dL (ref 0.76–1.27)
GFR calc non Af Amer: 90 mL/min/{1.73_m2} (ref >59)
GFR, EST AFRICAN AMERICAN: 104 mL/min/{1.73_m2} (ref >59)
GLUCOSE: 104 mg/dL — AB (ref 65–99)
Globulin, Total: 2.6 g/dL (ref 1.5–4.5)
Potassium: 3.8 mmol/L (ref 3.5–5.2)
Sodium: 142 mmol/L (ref 134–144)
Total Protein: 6.7 g/dL (ref 6.0–8.5)

## 2015-03-27 LAB — LIPID PANEL
Chol/HDL Ratio: 5.2 ratio — ABNORMAL HIGH (ref 0.0–5.0)
Cholesterol, Total: 192 mg/dL (ref 100–199)
HDL: 37 mg/dL — ABNORMAL LOW
LDL Calculated: 104 mg/dL — ABNORMAL HIGH (ref 0–99)
Triglycerides: 255 mg/dL — ABNORMAL HIGH (ref 0–149)
VLDL Cholesterol Cal: 51 mg/dL — ABNORMAL HIGH (ref 5–40)

## 2015-07-11 DIAGNOSIS — R7301 Impaired fasting glucose: Secondary | ICD-10-CM | POA: Diagnosis not present

## 2015-07-11 DIAGNOSIS — I10 Essential (primary) hypertension: Secondary | ICD-10-CM | POA: Diagnosis not present

## 2015-07-11 DIAGNOSIS — J309 Allergic rhinitis, unspecified: Secondary | ICD-10-CM | POA: Diagnosis not present

## 2015-07-11 DIAGNOSIS — Z79899 Other long term (current) drug therapy: Secondary | ICD-10-CM | POA: Diagnosis not present

## 2015-07-11 DIAGNOSIS — E782 Mixed hyperlipidemia: Secondary | ICD-10-CM | POA: Diagnosis not present

## 2015-07-11 DIAGNOSIS — Z139 Encounter for screening, unspecified: Secondary | ICD-10-CM | POA: Diagnosis not present

## 2015-07-30 DIAGNOSIS — R7301 Impaired fasting glucose: Secondary | ICD-10-CM | POA: Diagnosis not present

## 2015-07-30 DIAGNOSIS — I10 Essential (primary) hypertension: Secondary | ICD-10-CM | POA: Diagnosis not present

## 2015-07-30 DIAGNOSIS — E782 Mixed hyperlipidemia: Secondary | ICD-10-CM | POA: Diagnosis not present

## 2015-07-30 DIAGNOSIS — E669 Obesity, unspecified: Secondary | ICD-10-CM | POA: Diagnosis not present

## 2015-09-12 ENCOUNTER — Ambulatory Visit: Payer: BLUE CROSS/BLUE SHIELD | Admitting: Nurse Practitioner

## 2015-09-12 DIAGNOSIS — R109 Unspecified abdominal pain: Secondary | ICD-10-CM | POA: Diagnosis not present

## 2015-09-12 DIAGNOSIS — N23 Unspecified renal colic: Secondary | ICD-10-CM | POA: Diagnosis not present

## 2015-09-13 ENCOUNTER — Encounter: Payer: Self-pay | Admitting: Nurse Practitioner

## 2015-09-17 ENCOUNTER — Encounter: Payer: Self-pay | Admitting: Nurse Practitioner

## 2015-09-17 ENCOUNTER — Ambulatory Visit (INDEPENDENT_AMBULATORY_CARE_PROVIDER_SITE_OTHER): Payer: BLUE CROSS/BLUE SHIELD | Admitting: Nurse Practitioner

## 2015-09-17 ENCOUNTER — Telehealth: Payer: Self-pay | Admitting: *Deleted

## 2015-09-17 DIAGNOSIS — N200 Calculus of kidney: Secondary | ICD-10-CM | POA: Diagnosis not present

## 2015-09-17 LAB — URINALYSIS, COMPLETE
Bilirubin, UA: NEGATIVE
Glucose, UA: NEGATIVE
Ketones, UA: NEGATIVE
Leukocytes, UA: NEGATIVE
Nitrite, UA: NEGATIVE
PH UA: 7 (ref 5.0–7.5)
Specific Gravity, UA: 1.015 (ref 1.005–1.030)
UUROB: 1 mg/dL (ref 0.2–1.0)

## 2015-09-17 LAB — MICROSCOPIC EXAMINATION: Epithelial Cells (non renal): NONE SEEN /hpf (ref 0–10)

## 2015-09-17 MED ORDER — TAMSULOSIN HCL 0.4 MG PO CAPS: 0.4000 mg | ORAL_CAPSULE | Freq: Every day | ORAL | Status: DC

## 2015-09-17 NOTE — Patient Instructions (Signed)
Kidney Stones °Kidney stones (urolithiasis) are deposits that form inside your kidneys. The intense pain is caused by the stone moving through the urinary tract. When the stone moves, the ureter goes into spasm around the stone. The stone is usually passed in the urine.  °CAUSES  °· A disorder that makes certain neck glands produce too much parathyroid hormone (primary hyperparathyroidism). °· A buildup of uric acid crystals, similar to gout in your joints. °· Narrowing (stricture) of the ureter. °· A kidney obstruction present at birth (congenital obstruction). °· Previous surgery on the kidney or ureters. °· Numerous kidney infections. °SYMPTOMS  °· Feeling sick to your stomach (nauseous). °· Throwing up (vomiting). °· Blood in the urine (hematuria). °· Pain that usually spreads (radiates) to the groin. °· Frequency or urgency of urination. °DIAGNOSIS  °· Taking a history and physical exam. °· Blood or urine tests. °· CT scan. °· Occasionally, an examination of the inside of the urinary bladder (cystoscopy) is performed. °TREATMENT  °· Observation. °· Increasing your fluid intake. °· Extracorporeal shock wave lithotripsy--This is a noninvasive procedure that uses shock waves to break up kidney stones. °· Surgery may be needed if you have severe pain or persistent obstruction. There are various surgical procedures. Most of the procedures are performed with the use of small instruments. Only small incisions are needed to accommodate these instruments, so recovery time is minimized. °The size, location, and chemical composition are all important variables that will determine the proper choice of action for you. Talk to your health care provider to better understand your situation so that you will minimize the risk of injury to yourself and your kidney.  °HOME CARE INSTRUCTIONS  °· Drink enough water and fluids to keep your urine clear or pale yellow. This will help you to pass the stone or stone fragments. °· Strain  all urine through the provided strainer. Keep all particulate matter and stones for your health care provider to see. The stone causing the pain may be as small as a grain of salt. It is very important to use the strainer each and every time you pass your urine. The collection of your stone will allow your health care provider to analyze it and verify that a stone has actually passed. The stone analysis will often identify what you can do to reduce the incidence of recurrences. °· Only take over-the-counter or prescription medicines for pain, discomfort, or fever as directed by your health care provider. °· Keep all follow-up visits as told by your health care provider. This is important. °· Get follow-up X-rays if required. The absence of pain does not always mean that the stone has passed. It may have only stopped moving. If the urine remains completely obstructed, it can cause loss of kidney function or even complete destruction of the kidney. It is your responsibility to make sure X-rays and follow-ups are completed. Ultrasounds of the kidney can show blockages and the status of the kidney. Ultrasounds are not associated with any radiation and can be performed easily in a matter of minutes. °· Make changes to your daily diet as told by your health care provider. You may be told to: °¨ Limit the amount of salt that you eat. °¨ Eat 5 or more servings of fruits and vegetables each day. °¨ Limit the amount of meat, poultry, fish, and eggs that you eat. °· Collect a 24-hour urine sample as told by your health care provider. You may need to collect another urine sample every 6-12   months. °SEEK MEDICAL CARE IF: °· You experience pain that is progressive and unresponsive to any pain medicine you have been prescribed. °SEEK IMMEDIATE MEDICAL CARE IF:  °· Pain cannot be controlled with the prescribed medicine. °· You have a fever or shaking chills. °· The severity or intensity of pain increases over 18 hours and is not  relieved by pain medicine. °· You develop a new onset of abdominal pain. °· You feel faint or pass out. °· You are unable to urinate. °  °This information is not intended to replace advice given to you by your health care provider. Make sure you discuss any questions you have with your health care provider. °  °Document Released: 02/24/2005 Document Revised: 11/15/2014 Document Reviewed: 07/28/2012 °Elsevier Interactive Patient Education ©2016 [employer] ° °

## 2015-09-17 NOTE — Telephone Encounter (Signed)
Called [employer] and cancelled flomax.

## 2015-09-17 NOTE — Progress Notes (Signed)
   Subjective:    Patient ID: Ian Stewart, male    DOB: [birth year], 45 y.o.   MRN: [id number]  HPI  Patient comes in today for hospital follow up- He was scheduled to be seen last week for possible kidney stone- he no showed and ended up going to ER- was diagnosed with Kidney stone on left side. Was given a toradol shot. He says he is better and only hs pain in evening and is mainly left  Lower quadrant with occasional gripping pain in left testicle. Right now he describes pain as a dull pressure- has not had any pain meds since middle of night  Review of Systems  Constitutional: Negative.   Respiratory: Negative.   Cardiovascular: Negative.   Genitourinary: Negative.  Negative for urgency, frequency and hematuria.  Neurological: Negative.   Psychiatric/Behavioral: Negative.   All other systems reviewed and are negative.      Objective:   Physical Exam  Constitutional: He is oriented to person, place, and time. He appears well-developed and well-nourished. No distress.  Cardiovascular: Normal rate, regular rhythm and normal heart sounds.   Pulmonary/Chest: Effort normal and breath sounds normal.  Abdominal: Soft. Bowel sounds are normal. There is no tenderness.  Genitourinary:  No CVA tenderness  Neurological: He is alert and oriented to person, place, and time.  Skin: Skin is warm.  Psychiatric: He has a normal mood and affect. His behavior is normal. Judgment and thought content normal.   BP 134/91 mmHg  Pulse 71  Temp(Src) 99.2 F (37.3 C) (Oral)  Ht 6\' 1"  (1.854 m)  Wt 319 lb (144.697 kg)  BMI 42.10 kg/m2        Assessment & Plan:   1. Kidney stone on left side    Meds ordered this encounter  Medications  . tamsulosin (FLOMAX) 0.4 MG CAPS capsule    Sig: Take 1 capsule (0.4 mg total) by mouth daily.    Dispense:  30 capsule    Refill:  0    Order Specific Question:  Supervising Provider    Answer:  Eustaquio Maize [[id number]]    Continue pain meds as needed- cal  if run out forcr fluids RTO prn  [name] Done, FNP

## 2015-09-20 ENCOUNTER — Telehealth: Payer: Self-pay | Admitting: Nurse Practitioner

## 2015-09-20 NOTE — Telephone Encounter (Signed)
Pt has an appt on Monday with ortho. No further assistance needed.

## 2015-09-24 DIAGNOSIS — M25572 Pain in left ankle and joints of left foot: Secondary | ICD-10-CM | POA: Diagnosis not present

## 2015-10-22 DIAGNOSIS — E785 Hyperlipidemia, unspecified: Secondary | ICD-10-CM | POA: Diagnosis not present

## 2015-10-24 DIAGNOSIS — E669 Obesity, unspecified: Secondary | ICD-10-CM | POA: Diagnosis not present

## 2015-10-24 DIAGNOSIS — Z09 Encounter for follow-up examination after completed treatment for conditions other than malignant neoplasm: Secondary | ICD-10-CM | POA: Diagnosis not present

## 2015-10-24 DIAGNOSIS — E785 Hyperlipidemia, unspecified: Secondary | ICD-10-CM | POA: Diagnosis not present

## 2015-10-31 ENCOUNTER — Other Ambulatory Visit: Payer: Self-pay | Admitting: Nurse Practitioner

## 2015-10-31 DIAGNOSIS — E785 Hyperlipidemia, unspecified: Secondary | ICD-10-CM

## 2015-11-26 DIAGNOSIS — L03115 Cellulitis of right lower limb: Secondary | ICD-10-CM | POA: Diagnosis not present

## 2015-11-28 DIAGNOSIS — I10 Essential (primary) hypertension: Secondary | ICD-10-CM | POA: Diagnosis not present

## 2015-11-28 DIAGNOSIS — L0390 Cellulitis, unspecified: Secondary | ICD-10-CM | POA: Diagnosis not present

## 2015-11-28 DIAGNOSIS — E669 Obesity, unspecified: Secondary | ICD-10-CM | POA: Diagnosis not present

## 2015-11-28 DIAGNOSIS — E785 Hyperlipidemia, unspecified: Secondary | ICD-10-CM | POA: Diagnosis not present

## 2015-12-18 DIAGNOSIS — Z23 Encounter for immunization: Secondary | ICD-10-CM | POA: Diagnosis not present

## 2016-01-08 ENCOUNTER — Encounter: Payer: Self-pay | Admitting: Nurse Practitioner

## 2016-01-10 ENCOUNTER — Other Ambulatory Visit: Payer: Self-pay | Admitting: Nurse Practitioner

## 2016-01-10 DIAGNOSIS — E785 Hyperlipidemia, unspecified: Secondary | ICD-10-CM

## 2016-01-10 NOTE — Telephone Encounter (Signed)
Last refill without being seen 

## 2016-01-14 DIAGNOSIS — I10 Essential (primary) hypertension: Secondary | ICD-10-CM | POA: Diagnosis not present

## 2016-02-11 DIAGNOSIS — E669 Obesity, unspecified: Secondary | ICD-10-CM | POA: Diagnosis not present

## 2016-02-11 DIAGNOSIS — I10 Essential (primary) hypertension: Secondary | ICD-10-CM | POA: Diagnosis not present

## 2016-02-11 DIAGNOSIS — E785 Hyperlipidemia, unspecified: Secondary | ICD-10-CM | POA: Diagnosis not present

## 2016-02-11 DIAGNOSIS — J309 Allergic rhinitis, unspecified: Secondary | ICD-10-CM | POA: Diagnosis not present

## 2016-03-24 DIAGNOSIS — E785 Hyperlipidemia, unspecified: Secondary | ICD-10-CM | POA: Diagnosis not present

## 2016-03-24 DIAGNOSIS — Z6841 Body Mass Index (BMI) 40.0 and over, adult: Secondary | ICD-10-CM | POA: Diagnosis not present

## 2016-03-24 DIAGNOSIS — J309 Allergic rhinitis, unspecified: Secondary | ICD-10-CM | POA: Diagnosis not present

## 2016-03-24 DIAGNOSIS — I10 Essential (primary) hypertension: Secondary | ICD-10-CM | POA: Diagnosis not present

## 2016-06-09 DIAGNOSIS — E785 Hyperlipidemia, unspecified: Secondary | ICD-10-CM | POA: Diagnosis not present

## 2016-06-09 DIAGNOSIS — Z6841 Body Mass Index (BMI) 40.0 and over, adult: Secondary | ICD-10-CM | POA: Diagnosis not present

## 2016-06-09 DIAGNOSIS — I10 Essential (primary) hypertension: Secondary | ICD-10-CM | POA: Diagnosis not present

## 2016-07-07 DIAGNOSIS — E785 Hyperlipidemia, unspecified: Secondary | ICD-10-CM | POA: Diagnosis not present

## 2016-07-07 DIAGNOSIS — E559 Vitamin D deficiency, unspecified: Secondary | ICD-10-CM | POA: Diagnosis not present

## 2016-07-07 DIAGNOSIS — I10 Essential (primary) hypertension: Secondary | ICD-10-CM | POA: Diagnosis not present

## 2016-07-07 DIAGNOSIS — Z760 Encounter for issue of repeat prescription: Secondary | ICD-10-CM | POA: Diagnosis not present

## 2016-07-09 DIAGNOSIS — I10 Essential (primary) hypertension: Secondary | ICD-10-CM | POA: Diagnosis not present

## 2016-07-09 DIAGNOSIS — E785 Hyperlipidemia, unspecified: Secondary | ICD-10-CM | POA: Diagnosis not present

## 2016-07-09 DIAGNOSIS — R609 Edema, unspecified: Secondary | ICD-10-CM | POA: Diagnosis not present

## 2016-07-09 DIAGNOSIS — E559 Vitamin D deficiency, unspecified: Secondary | ICD-10-CM | POA: Diagnosis not present

## 2016-11-05 DIAGNOSIS — E785 Hyperlipidemia, unspecified: Secondary | ICD-10-CM | POA: Diagnosis not present

## 2016-11-05 DIAGNOSIS — J309 Allergic rhinitis, unspecified: Secondary | ICD-10-CM | POA: Diagnosis not present

## 2016-11-05 DIAGNOSIS — Z6841 Body Mass Index (BMI) 40.0 and over, adult: Secondary | ICD-10-CM | POA: Diagnosis not present

## 2016-11-05 DIAGNOSIS — Z008 Encounter for other general examination: Secondary | ICD-10-CM | POA: Diagnosis not present

## 2016-11-05 DIAGNOSIS — I10 Essential (primary) hypertension: Secondary | ICD-10-CM | POA: Diagnosis not present

## 2016-12-15 DIAGNOSIS — R7309 Other abnormal glucose: Secondary | ICD-10-CM | POA: Diagnosis not present

## 2016-12-15 DIAGNOSIS — E559 Vitamin D deficiency, unspecified: Secondary | ICD-10-CM | POA: Diagnosis not present

## 2017-02-02 DIAGNOSIS — H59812 Chorioretinal scars after surgery for detachment, left eye: Secondary | ICD-10-CM | POA: Diagnosis not present

## 2017-02-02 DIAGNOSIS — H43813 Vitreous degeneration, bilateral: Secondary | ICD-10-CM | POA: Diagnosis not present

## 2017-02-02 DIAGNOSIS — H3530 Unspecified macular degeneration: Secondary | ICD-10-CM | POA: Diagnosis not present

## 2017-02-02 DIAGNOSIS — H31001 Unspecified chorioretinal scars, right eye: Secondary | ICD-10-CM | POA: Diagnosis not present

## 2017-02-05 DIAGNOSIS — H3589 Other specified retinal disorders: Secondary | ICD-10-CM | POA: Diagnosis not present

## 2017-02-18 DIAGNOSIS — Z6841 Body Mass Index (BMI) 40.0 and over, adult: Secondary | ICD-10-CM | POA: Diagnosis not present

## 2017-02-18 DIAGNOSIS — E785 Hyperlipidemia, unspecified: Secondary | ICD-10-CM | POA: Diagnosis not present

## 2017-02-18 DIAGNOSIS — J309 Allergic rhinitis, unspecified: Secondary | ICD-10-CM | POA: Diagnosis not present

## 2017-02-18 DIAGNOSIS — I10 Essential (primary) hypertension: Secondary | ICD-10-CM | POA: Diagnosis not present

## 2017-04-20 DIAGNOSIS — I10 Essential (primary) hypertension: Secondary | ICD-10-CM | POA: Diagnosis not present

## 2017-04-20 DIAGNOSIS — R7309 Other abnormal glucose: Secondary | ICD-10-CM | POA: Diagnosis not present

## 2017-04-20 DIAGNOSIS — E785 Hyperlipidemia, unspecified: Secondary | ICD-10-CM | POA: Diagnosis not present

## 2017-04-20 DIAGNOSIS — E669 Obesity, unspecified: Secondary | ICD-10-CM | POA: Diagnosis not present

## 2017-06-11 ENCOUNTER — Ambulatory Visit: Payer: BLUE CROSS/BLUE SHIELD | Admitting: Family Medicine

## 2017-06-11 ENCOUNTER — Ambulatory Visit (INDEPENDENT_AMBULATORY_CARE_PROVIDER_SITE_OTHER): Payer: BLUE CROSS/BLUE SHIELD

## 2017-06-11 VITALS — BP 150/102 | HR 63 | Temp 97.0°F | Ht 73.0 in | Wt 339.0 lb

## 2017-06-11 DIAGNOSIS — R319 Hematuria, unspecified: Secondary | ICD-10-CM | POA: Diagnosis not present

## 2017-06-11 DIAGNOSIS — I10 Essential (primary) hypertension: Secondary | ICD-10-CM

## 2017-06-11 DIAGNOSIS — R109 Unspecified abdominal pain: Secondary | ICD-10-CM

## 2017-06-11 LAB — URINALYSIS, COMPLETE
BILIRUBIN UA: NEGATIVE
Glucose, UA: NEGATIVE
KETONES UA: NEGATIVE
LEUKOCYTES UA: NEGATIVE
NITRITE UA: NEGATIVE
SPEC GRAV UA: 1.02 (ref 1.005–1.030)
Urobilinogen, Ur: 0.2 mg/dL (ref 0.2–1.0)
pH, UA: 7 (ref 5.0–7.5)

## 2017-06-11 LAB — MICROSCOPIC EXAMINATION
Bacteria, UA: NONE SEEN
Epithelial Cells (non renal): NONE SEEN /hpf (ref 0–10)
Renal Epithel, UA: NONE SEEN /hpf
WBC, UA: NONE SEEN /hpf (ref 0–5)

## 2017-06-11 MED ORDER — TAMSULOSIN HCL 0.4 MG PO CAPS: 0.4000 mg | ORAL_CAPSULE | Freq: Every day | ORAL | 0 refills | Status: DC

## 2017-06-11 MED ORDER — KETOROLAC TROMETHAMINE 30 MG/ML IJ SOLN: 30.0000 mg | Freq: Once | INTRAMUSCULAR | Status: DC

## 2017-06-11 MED ORDER — NEBIVOLOL HCL 10 MG PO TABS: 10.0000 mg | ORAL_TABLET | Freq: Every day | ORAL | Status: DC

## 2017-06-11 MED ORDER — KETOROLAC TROMETHAMINE 60 MG/2ML IM SOLN
60.0000 mg | Freq: Once | INTRAMUSCULAR | Status: AC
  Administered 2017-06-11: 60 mg via INTRAMUSCULAR

## 2017-06-11 MED ORDER — HYDROCODONE-ACETAMINOPHEN 10-325 MG PO TABS: 1.0000 | ORAL_TABLET | Freq: Three times a day (TID) | ORAL | 0 refills | Status: DC | PRN

## 2017-06-11 NOTE — Patient Instructions (Signed)
There does appear to be an opacity in the lower right side of your urinary tract.  Difficult to tell if this is an actual kidney stone or fecalith (stone in your stool).  You were given Flomax to take daily for the next 7 days.  I have also given you a dose of Toradol via injection today for pain relief.  I have given you small quantity of Norco to take if needed for severe pain.  If your symptoms do not improve or significantly worsen, please seek immediate medical attention in the emergency department.  I will contact you with the formal read by radiology once it is available.  If they read it as a very large stone, we may need to refer you to the urologist.   Kidney Stones Kidney stones (urolithiasis) are rock-like masses that form inside of the kidneys. Kidneys are organs that make pee (urine). A kidney stone can cause very bad pain and can block the flow of pee. The stone usually leaves your body (passes) through your pee. You may need to have a doctor take out the stone. Follow these instructions at home: Eating and drinking  Drink enough fluid to keep your pee clear or pale yellow. This will help you pass the stone.  If told by your doctor, change the foods you eat (your diet). This may include: ? Limiting how much salt (sodium) you eat. ? Eating more fruits and vegetables. ? Limiting how much meat, poultry, fish, and eggs you eat.  Follow instructions from your doctor about eating or drinking restrictions. General instructions  Collect pee samples as told by your doctor. You may need to collect a pee sample: ? 24 hours after a stone comes out. ? 8-12 weeks after a stone comes out, and every 6-12 months after that.  Strain your pee every time you pee (urinate), for as long as told. Use the strainer that your doctor recommends.  Do not throw out the stone. Keep it so that it can be tested by your doctor.  Take over-the-counter and prescription medicines only as told by your  doctor.  Keep all follow-up visits as told by your doctor. This is important. You may need follow-up tests. Preventing kidney stones To prevent another kidney stone:  Drink enough fluid to keep your pee clear or pale yellow. This is the best way to prevent kidney stones.  Eat healthy foods.  Avoid certain foods as told by your doctor. You may be told to eat less protein.  Stay at a healthy weight.  Contact a doctor if:  You have pain that gets worse or does not get better with medicine. Get help right away if:  You have a fever or chills.  You get very bad pain.  You get new pain in your belly (abdomen).  You pass out (faint).  You cannot pee. This information is not intended to replace advice given to you by your health care provider. Make sure you discuss any questions you have with your health care provider. Document Released: 08/13/2007 Document Revised: 11/13/2015 Document Reviewed: 11/13/2015 Elsevier Interactive Patient Education  2017 [employer].

## 2017-06-11 NOTE — Progress Notes (Signed)
Subjective: CC: Right flank pain PCP: Chevis Pretty, FNP WUJ:Ian Stewart is a 47 y.o. male presenting to clinic today for:  1. Right flank pain Patient notes onset of right flank pain about 1 week ago.  He notes its typically intermittent and occasionally sharp.  Pain seemed worse this morning but has since eased off.  There is questionable urinary urgency.  He notes at baseline he has increased urinary frequency secondary to his blood pressure medications.  No associated nausea, vomiting, fevers, dysuria, hematuria.  He was treated for renal stone a couple of years ago with Flomax.  He notes that he never visualize the stone but pain did seem to improve with the medications prescribed.  2.  Hypertension Patient reports compliance with oral antihypertensives.  He notes that his Bystolic was decreased to 5 mg daily a couple of years ago after he had lost substantial amount of weight.  Most recently, the physician assistant at his work recommended that he perhaps have his PCP increase his Bystolic again because blood pressures were not at goal.  Patient denies chest pain, visual disturbance, lower extremity edema.  He notes occasional shortness of breath with exertion.   ROS: Per HPI  Allergies  Allergen Reactions  . Lipitor [Atorvastatin] Other (See Comments)    Muscle aches   Past Medical History:  Diagnosis Date  . Arrhythmia    BRADYCARDIA/ 41-50 BEATS  . Hyperlipidemia   . Hypertension     Current Outpatient Medications:  .  amLODipine (NORVASC) 10 MG tablet, Take 1 tablet (10 mg total) by mouth daily., Disp: 30 tablet, Rfl: 3 .  aspirin 81 MG tablet, Take 81 mg by mouth daily., Disp: , Rfl:  .  cholecalciferol (VITAMIN D) 1000 UNITS tablet, Take 5,000 Units by mouth daily. , Disp: , Rfl:  .  fluticasone (FLONASE) 50 MCG/ACT nasal spray, 2 sprays daily., Disp: , Rfl:  .  Multiple Vitamin (MULTIVITAMIN) capsule, Take 1 capsule by mouth daily., Disp: , Rfl:  .  nebivolol  (BYSTOLIC) 10 MG tablet, Take 0.5 tablets (5 mg total) by mouth daily., Disp: 90 tablet, Rfl: 1 .  simvastatin (ZOCOR) 20 MG tablet, TAKE 1 TABLET BY MOUTH  DAILY, Disp: 90 tablet, Rfl: 0 .  valsartan-hydrochlorothiazide (DIOVAN-HCT) 320-25 MG per tablet, Take 1 tablet by mouth daily., Disp: 90 tablet, Rfl: 1 Social History   Socioeconomic History  . Marital status: Single    Spouse name: Not on file  . Number of children: Not on file  . Years of education: Not on file  . Highest education level: Not on file  Occupational History  . Not on file  Social Needs  . Financial resource strain: Not on file  . Food insecurity:    Worry: Not on file    Inability: Not on file  . Transportation needs:    Medical: Not on file    Non-medical: Not on file  Tobacco Use  . Smoking status: Never Smoker  Substance and Sexual Activity  . Alcohol use: No  . Drug use: No  . Sexual activity: Not on file  Lifestyle  . Physical activity:    Days per week: Not on file    Minutes per session: Not on file  . Stress: Not on file  Relationships  . Social connections:    Talks on phone: Not on file    Gets together: Not on file    Attends religious service: Not on file    Active member of  club or organization: Not on file    Attends meetings of clubs or organizations: Not on file    Relationship status: Not on file  . Intimate partner violence:    Fear of current or ex partner: Not on file    Emotionally abused: Not on file    Physically abused: Not on file    Forced sexual activity: Not on file  Other Topics Concern  . Not on file  Social History Narrative   Works at [employer] and lives alone.     Family History  Problem Relation Age of Onset  . CAD Mother 37  . CAD Father 72       CABG, died age 67    Objective: Office vital signs reviewed. BP (!) 150/102   Pulse 63   Temp (!) 97 F (36.1 C) (Oral)   Ht 6\' 1"  (1.854 m)   Wt (!) 339 lb (153.8 kg)   BMI 44.73 kg/m   Physical  Examination:  General: Awake, alert, obese, No acute distress HEENT: Normal, sclera white, MMM Cardio: regular rate, +2 DP Pulm:  normal work of breathing on room air GU: no suprapubic TTP, +R CVA TTP Neuro: no focal neurologic deficits, follows all commands.  Dg Abd 1 View  Result Date: 06/11/2017 CLINICAL DATA:  47 year old male with right flank pain and hematuria. EXAM: ABDOMEN - 1 VIEW COMPARISON:  None. FINDINGS: Suboptimal radiographic technique of the abdomen and pelvis, quantum mottle artifact and under penetration. The lung bases appear negative. Non obstructed bowel gas pattern. No acute osseous abnormality identified. A small round calcific density in the right hemipelvis is compatible with phlebolith. No definite urologic calculus identified. IMPRESSION: Suboptimal radiographic technique with no urologic calculus identified. Noncontrast CT Abdomen and Pelvis would be most sensitive. Electronically Signed   By: Genevie Ann M.D.   On: 06/11/2017 16:36   Results for orders placed or performed in visit on 06/11/17 (from the past 24 hour(s))  Urinalysis, Complete     Status: Abnormal   Collection Time: 06/11/17  4:09 PM  Result Value Ref Range   Specific Gravity, UA 1.020 1.005 - 1.030   pH, UA 7.0 5.0 - 7.5   Color, UA Yellow Yellow   Appearance Ur Clear Clear   Leukocytes, UA Negative Negative   Protein, UA 3+ (A) Negative/Trace   Glucose, UA Negative Negative   Ketones, UA Negative Negative   RBC, UA 1+ (A) Negative   Bilirubin, UA Negative Negative   Urobilinogen, Ur 0.2 0.2 - 1.0 mg/dL   Nitrite, UA Negative Negative   Microscopic Examination See below:    Narrative   Performed at:  [address] [place], [place]  [id number] Lab Director: Colletta Maryland [facility], Phone:  [phone]  Microscopic Examination     Status: Abnormal   Collection Time: 06/11/17  4:09 PM  Result Value Ref Range   WBC, UA None seen 0 - 5 /hpf   RBC, UA 3-10 (A) 0 - 2 /hpf    Epithelial Cells (non renal) None seen 0 - 10 /hpf   Renal Epithel, UA None seen None seen /hpf   Bacteria, UA None seen None seen/Few   Narrative   Performed at:  [facility] [address] [place], [place]  [id number] Lab Director: Colletta Maryland [facility], Phone:  [phone]     Assessment/ Plan: 47 y.o. male   1. Flank pain Urinalysis suggestive of possible renal stone as there are  no findings of infection with the exception of RBCs on microscopy.  I obtained a KUB which showed an opacity within the pelvis concerning for possible renal stone versus fecalith.  Radiology review did not think that this looks consistent with a renal stone and recommended CT if clinically indicated.  Will trial patient with Flomax.  Urine sent for culture.  Toradol injection administered.  Norco provided.  Narcotic database reviewed.  Return precautions discussed and reasons for emergent evaluation emergency department reviewed.  Patient was good understanding will follow-up as needed. - Urinalysis, Complete - DG Abd 1 View; Future - tamsulosin (FLOMAX) 0.4 MG CAPS capsule; Take 1 capsule (0.4 mg total) by mouth daily.  Dispense: 7 capsule; Refill: 0 - ketorolac (TORADOL) injection 60 mg  2. Hematuria, unspecified type - DG Abd 1 View; Future  3. Essential hypertension Blood pressure not at goal.  Increase Bystolic to 10 mg daily.  Follow-up with PCP for blood pressure recheck. - nebivolol (BYSTOLIC) 10 MG tablet; Take 1 tablet (10 mg total) by mouth daily.  Dispense: 90 tablet; Refill: 01   Orders Placed This Encounter  Procedures  . Microscopic Examination  . DG Abd 1 View    Standing Status:   Future    Number of Occurrences:   1    Standing Expiration Date:   08/11/2018    Order Specific Question:   Reason for Exam (SYMPTOM  OR DIAGNOSIS REQUIRED)    Answer:   hematuria, right flank pain. check for renal stone.    Order Specific Question:   Preferred imaging location?    Answer:    Internal  . Urinalysis, Complete   Meds ordered this encounter  Medications  . nebivolol (BYSTOLIC) 10 MG tablet    Sig: Take 1 tablet (10 mg total) by mouth daily.    Dispense:  90 tablet    Refill:  01  . tamsulosin (FLOMAX) 0.4 MG CAPS capsule    Sig: Take 1 capsule (0.4 mg total) by mouth daily.    Dispense:  7 capsule    Refill:  0  . DISCONTD: ketorolac (TORADOL) 30 MG/ML injection 30 mg  . HYDROcodone-acetaminophen (NORCO) 10-325 MG tablet    Sig: Take 1 tablet by mouth every 8 (eight) hours as needed.    Dispense:  10 tablet    Refill:  0  . ketorolac (TORADOL) injection 60 mg   Janora Norlander, DO [place] [phone]

## 2017-08-17 DIAGNOSIS — E785 Hyperlipidemia, unspecified: Secondary | ICD-10-CM | POA: Diagnosis not present

## 2017-08-17 DIAGNOSIS — I10 Essential (primary) hypertension: Secondary | ICD-10-CM | POA: Diagnosis not present

## 2017-08-17 DIAGNOSIS — E669 Obesity, unspecified: Secondary | ICD-10-CM | POA: Diagnosis not present

## 2017-08-17 DIAGNOSIS — R7309 Other abnormal glucose: Secondary | ICD-10-CM | POA: Diagnosis not present

## 2017-10-19 DIAGNOSIS — E559 Vitamin D deficiency, unspecified: Secondary | ICD-10-CM | POA: Diagnosis not present

## 2017-10-19 DIAGNOSIS — E785 Hyperlipidemia, unspecified: Secondary | ICD-10-CM | POA: Diagnosis not present

## 2017-10-19 DIAGNOSIS — I10 Essential (primary) hypertension: Secondary | ICD-10-CM | POA: Diagnosis not present

## 2017-10-19 DIAGNOSIS — Z008 Encounter for other general examination: Secondary | ICD-10-CM | POA: Diagnosis not present

## 2017-11-05 ENCOUNTER — Other Ambulatory Visit: Payer: Self-pay | Admitting: Family Medicine

## 2017-11-05 DIAGNOSIS — I10 Essential (primary) hypertension: Secondary | ICD-10-CM

## 2017-12-08 ENCOUNTER — Telehealth: Payer: Self-pay | Admitting: Nurse Practitioner

## 2017-12-08 NOTE — Telephone Encounter (Signed)
[employer] called with collaberating information

## 2017-12-16 DIAGNOSIS — R001 Bradycardia, unspecified: Secondary | ICD-10-CM | POA: Diagnosis not present

## 2017-12-16 DIAGNOSIS — G479 Sleep disorder, unspecified: Secondary | ICD-10-CM | POA: Diagnosis not present

## 2017-12-17 ENCOUNTER — Encounter (HOSPITAL_COMMUNITY): Payer: Self-pay | Admitting: Emergency Medicine

## 2017-12-17 ENCOUNTER — Other Ambulatory Visit: Payer: Self-pay

## 2017-12-17 ENCOUNTER — Ambulatory Visit: Payer: BLUE CROSS/BLUE SHIELD | Admitting: Family Medicine

## 2017-12-17 ENCOUNTER — Emergency Department (HOSPITAL_COMMUNITY): Payer: BLUE CROSS/BLUE SHIELD

## 2017-12-17 ENCOUNTER — Encounter: Payer: Self-pay | Admitting: Family Medicine

## 2017-12-17 ENCOUNTER — Emergency Department (HOSPITAL_COMMUNITY)
Admission: EM | Admit: 2017-12-17 | Discharge: 2017-12-17 | Disposition: A | Discharge status: Home / Self Care | Payer: BLUE CROSS/BLUE SHIELD | Attending: Emergency Medicine | Admitting: Emergency Medicine

## 2017-12-17 VITALS — BP 149/95 | HR 57 | Temp 97.8°F | Ht 73.0 in | Wt 329.0 lb

## 2017-12-17 DIAGNOSIS — I160 Hypertensive urgency: Secondary | ICD-10-CM | POA: Diagnosis not present

## 2017-12-17 DIAGNOSIS — Z79899 Other long term (current) drug therapy: Secondary | ICD-10-CM | POA: Insufficient documentation

## 2017-12-17 DIAGNOSIS — R001 Bradycardia, unspecified: Secondary | ICD-10-CM

## 2017-12-17 DIAGNOSIS — Z87898 Personal history of other specified conditions: Secondary | ICD-10-CM

## 2017-12-17 DIAGNOSIS — R079 Chest pain, unspecified: Secondary | ICD-10-CM

## 2017-12-17 DIAGNOSIS — Z7982 Long term (current) use of aspirin: Secondary | ICD-10-CM | POA: Diagnosis not present

## 2017-12-17 DIAGNOSIS — I10 Essential (primary) hypertension: Secondary | ICD-10-CM | POA: Insufficient documentation

## 2017-12-17 DIAGNOSIS — Z8679 Personal history of other diseases of the circulatory system: Secondary | ICD-10-CM | POA: Diagnosis not present

## 2017-12-17 DIAGNOSIS — R109 Unspecified abdominal pain: Secondary | ICD-10-CM

## 2017-12-17 LAB — MICROSCOPIC EXAMINATION
Bacteria, UA: NONE SEEN
Epithelial Cells (non renal): NONE SEEN /hpf (ref 0–10)
RENAL EPITHEL UA: NONE SEEN /HPF
WBC UA: NONE SEEN /HPF (ref 0–5)

## 2017-12-17 LAB — BASIC METABOLIC PANEL
Anion gap: 7 (ref 5–15)
BUN: 18 mg/dL (ref 6–20)
CHLORIDE: 105 mmol/L (ref 98–111)
CO2: 28 mmol/L (ref 22–32)
Calcium: 9.2 mg/dL (ref 8.9–10.3)
Creatinine, Ser: 1.26 mg/dL — ABNORMAL HIGH (ref 0.61–1.24)
GFR calc Af Amer: >60 mL/min (ref >60)
GFR calc non Af Amer: >60 mL/min (ref >60)
Glucose, Bld: 110 mg/dL — ABNORMAL HIGH (ref 70–99)
POTASSIUM: 3.6 mmol/L (ref 3.5–5.1)
Sodium: 140 mmol/L (ref 135–145)

## 2017-12-17 LAB — URINALYSIS, COMPLETE
Bilirubin, UA: NEGATIVE
Glucose, UA: NEGATIVE
KETONES UA: NEGATIVE
Leukocytes, UA: NEGATIVE
NITRITE UA: NEGATIVE
SPEC GRAV UA: 1.025 (ref 1.005–1.030)
Urobilinogen, Ur: 0.2 mg/dL (ref 0.2–1.0)
pH, UA: 5.5 (ref 5.0–7.5)

## 2017-12-17 LAB — CBC
HCT: 41 % (ref 39.0–52.0)
HEMOGLOBIN: 14 g/dL (ref 13.0–17.0)
MCH: 30 pg (ref 26.0–34.0)
MCHC: 34.1 g/dL (ref 30.0–36.0)
MCV: 87.8 fL (ref 80.0–100.0)
Platelets: 247 10*3/uL (ref 150–400)
RBC: 4.67 MIL/uL (ref 4.22–5.81)
RDW: 14 % (ref 11.5–15.5)
WBC: 5.1 10*3/uL (ref 4.0–10.5)
nRBC: 0 % (ref 0.0–0.2)

## 2017-12-17 LAB — TROPONIN I
Troponin I: 0.03 ng/mL (ref <0.03)
Troponin I: 0.03 ng/mL (ref <0.03)

## 2017-12-17 MED ORDER — ASPIRIN 81 MG PO CHEW
324.0000 mg | CHEWABLE_TABLET | Freq: Once | ORAL | Status: AC
  Administered 2017-12-17: 324 mg via ORAL
  Filled 2017-12-17: qty 4

## 2017-12-17 NOTE — ED Notes (Signed)
Lab at bedside obtaining second troponin.

## 2017-12-17 NOTE — Progress Notes (Addendum)
Subjective:    Patient ID: Ian Stewart, male    DOB: 1970-10-30, 47 y.o.   MRN: [id number]  Chief Complaint:  Bradycardia (35-42, mostly in early am hours; was alerted by watch, first noticed on 12/02/17); Chest Pain, pain in shoulder blade region; and Pain in kidney area (history of kidney stone, was seen in April, unsure if it ever passed)   HPI: Ian Stewart is a 47 y.o. male presenting on 12/17/2017 for Bradycardia (35-42, mostly in early am hours; was alerted by watch, first noticed on 12/02/17); Chest Pain, pain in shoulder blade region; and Pain in kidney area (history of kidney stone, was seen in April, unsure if it ever passed)  Pt presents today for chest pain, bradycardia, hypertension, and right flank pain. Pt states his Apple watch alerted him to the bradycardia on 12-02-17. States he has bradycardia on a daily basis between the hours of 0300 and 0500 ranging in the 30s and 40s. Pt states since this incident he has decreased his bystolic dose to 5 mg daily instead of 10 mg daily. He reports the bradycardia has continued. He reports intermittent chest pain. States this has been ongoing for several days. States the pain radiates across his chest into his shoulder. He denies nausea, vomiting, or diaphoresis with the pain. States he does have shortness of breath with the pain. Pt states he has not tried any remedies for the pain. Pt also reports right flank pain. States this has been intermittent for several weeks and is worse in the mornings when he gets out of bed. He denies hematuria or dysuria with the pain.   Relevant past medical, surgical, family, and social history reviewed and updated as indicated.  Allergies and medications reviewed and updated.   Past Medical History:  Diagnosis Date  . Arrhythmia    BRADYCARDIA/ 41-50 BEATS  . Hyperlipidemia   . Hypertension     Past Surgical History:  Procedure Laterality Date  . RETINAL DETACHMENT SURGERY      Social History    Socioeconomic History  . Marital status: Single    Spouse name: Not on file  . Number of children: Not on file  . Years of education: Not on file  . Highest education level: Not on file  Occupational History  . Not on file  Social Needs  . Financial resource strain: Not on file  . Food insecurity:    Worry: Not on file    Inability: Not on file  . Transportation needs:    Medical: Not on file    Non-medical: Not on file  Tobacco Use  . Smoking status: Never Smoker  . Smokeless tobacco: Never Used  Substance and Sexual Activity  . Alcohol use: No  . Drug use: No  . Sexual activity: Not on file  Lifestyle  . Physical activity:    Days per week: Not on file    Minutes per session: Not on file  . Stress: Not on file  Relationships  . Social connections:    Talks on phone: Not on file    Gets together: Not on file    Attends religious service: Not on file    Active member of club or organization: Not on file    Attends meetings of clubs or organizations: Not on file    Relationship status: Not on file  . Intimate partner violence:    Fear of current or ex partner: Not on file    Emotionally abused: Not  on file    Physically abused: Not on file    Forced sexual activity: Not on file  Other Topics Concern  . Not on file  Social History Narrative   Works at [employer] and lives alone.      Outpatient Encounter Medications as of 12/17/2017  Medication Sig  . amLODipine (NORVASC) 10 MG tablet Take 1 tablet (10 mg total) by mouth daily.  Marland Kitchen aspirin 81 MG tablet Take 81 mg by mouth daily.  Marland Kitchen BYSTOLIC 10 MG tablet TAKE 1 TABLET BY MOUTH  DAILY  . cholecalciferol (VITAMIN D) 1000 UNITS tablet Take 5,000 Units by mouth daily.   . fluticasone (FLONASE) 50 MCG/ACT nasal spray 2 sprays daily.  . Multiple Vitamin (MULTIVITAMIN) capsule Take 1 capsule by mouth daily.  . simvastatin (ZOCOR) 20 MG tablet TAKE 1 TABLET BY MOUTH  DAILY  . valsartan-hydrochlorothiazide (DIOVAN-HCT)  320-25 MG per tablet Take 1 tablet by mouth daily.  . [DISCONTINUED] HYDROcodone-acetaminophen (NORCO) 10-325 MG tablet Take 1 tablet by mouth every 8 (eight) hours as needed.  . [DISCONTINUED] tamsulosin (FLOMAX) 0.4 MG CAPS capsule Take 1 capsule (0.4 mg total) by mouth daily.   No facility-administered encounter medications on file as of 12/17/2017.     Allergies  Allergen Reactions  . Lipitor [Atorvastatin] Other (See Comments)    Muscle aches    Review of Systems  Constitutional: Positive for fatigue. Negative for chills and fever.  Respiratory: Positive for chest tightness and shortness of breath. Negative for cough.   Cardiovascular: Positive for chest pain and leg swelling. Negative for palpitations.       Bradycardia  Gastrointestinal: Negative for abdominal pain, constipation, diarrhea, nausea and vomiting.  Genitourinary: Positive for flank pain. Negative for difficulty urinating, dysuria, hematuria and urgency.  Neurological: Negative for dizziness, syncope, weakness, light-headedness, numbness and headaches.  Psychiatric/Behavioral: Negative for confusion.  All other systems reviewed and are negative.       Objective:    BP (!) 149/95   Pulse (!) 57   Temp 97.8 F (36.6 C) (Oral)   Ht 6\' 1"  (1.854 m)   Wt (!) 329 lb (149.2 kg)   BMI 43.41 kg/m    Wt Readings from Last 3 Encounters:  12/17/17 (!) 329 lb (149.2 kg)  06/11/17 (!) 339 lb (153.8 kg)  09/17/15 (!) 319 lb (144.7 kg)    Physical Exam  Constitutional: He is oriented to person, place, and time. He appears well-developed and well-nourished. He is cooperative. No distress.  HENT:  Head: Normocephalic and atraumatic.  Right Ear: Hearing normal.  Left Ear: Hearing normal.  Neck: No JVD present. Carotid bruit is not present.  Cardiovascular: Normal heart sounds. Bradycardia present. Exam reveals no gallop and no friction rub.  No murmur heard. 1+ pitting edema to bilateral ankles   Pulmonary/Chest:  Effort normal. No respiratory distress. He has no wheezes. He has no rhonchi. He has no rales.  Abdominal: Soft. There is CVA tenderness (right).  Neurological: He is alert and oriented to person, place, and time.  Skin: Skin is warm and dry. Capillary refill takes less than 2 seconds. He is not diaphoretic. No pallor.  Psychiatric: He has a normal mood and affect. His speech is normal and behavior is normal. Judgment and thought content normal. Cognition and memory are normal.  Nursing note and vitals reviewed.      EKG: Sinus bradycardia with T wave abnormalities. Monia Pouch, FNP-C  Discussed case with Dr. Warrick Parisian and he agrees that  further evaluation in the emergency department is warranted.    Pertinent labs & imaging results that were available during my care of the patient were reviewed by me and considered in my medical decision making.  Assessment & Plan:  Giovanie was seen today for bradycardia, chest pain, pain in shoulder blade region and pain in kidney area.  Diagnoses and all orders for this visit:  Chest pain, unspecified type -     EKG 12-Lead  Hypertensive urgency  Flank pain -     Urinalysis, Complete  Bradycardia    Follow up plan:  Pt sent to the ED via EMS for hypertensive Urgency, Chest Pain, and Bradycardia Spoke with [facility] concerning pt.   The above assessment and management plan was discussed with the patient. The patient verbalized understanding of and has agreed to the management plan.  Monia Pouch, FNP-C [facility] [phone]

## 2017-12-17 NOTE — ED Triage Notes (Signed)
Pt sent by [facility] for bradycardia (HR 64 upon EMS arrival) and mild chest discomfort since September 24. Pt states his Apple Watch sometimes shows a heart rate as low as in the 30s.

## 2017-12-17 NOTE — Patient Instructions (Signed)
Bradycardia, Adult °Bradycardia is a slower-than-normal heartbeat. A normal resting heart rate for an adult ranges from 60 to 100 beats per minute. With bradycardia, the resting heart rate is less than 60 beats per minute. °Bradycardia can prevent enough oxygen from reaching certain areas of your body when you are active. It can be serious if it keeps enough oxygen from reaching your brain and other parts of your body. Bradycardia is not a problem for everyone. For some healthy adults, a slow resting heart rate is normal. °What are the causes? °This condition may be caused by: °· A problem with the heart, including: °? A problem with the heart's electrical system, such as a heart block. °? A problem with the heart's natural pacemaker (sinus node). °? Heart disease. °? A heart attack. °? Heart damage. °? A heart infection. °? A heart condition that is present at birth (congenital heart defect). °· Certain medicines that treat heart conditions. °· Certain conditions, such as hypothyroidism and obstructive sleep apnea. °· Problems with the balance of chemicals and other substances, like potassium, in the blood. ° °What increases the risk? °This condition is more likely to develop in adults who: °· Are age 65 or older. °· Have high blood pressure (hypertension), high cholesterol (hyperlipidemia), or diabetes. °· Drink heavily, use tobacco or nicotine products, or use drugs. °· Are stressed. ° °What are the signs or symptoms? °Symptoms of this condition include: °· Light-headedness. °· Feeling faint or fainting. °· Fatigue and weakness. °· Shortness of breath. °· Chest pain (angina). °· Drowsiness. °· Confusion. °· Dizziness. ° °How is this diagnosed? °This condition may be diagnosed based on: °· Your symptoms. °· Your medical history. °· A physical exam. ° °During the exam, your health care provider will listen to your heartbeat and check your pulse. To confirm the diagnosis, your health care provider may order tests,  such as: °· Blood tests. °· An electrocardiogram (ECG). This test records the heart's electrical activity. The test can show how fast your heart is beating and whether the heartbeat is steady. °· A test in which you wear a portable device (event recorder or Holter monitor) to record your heart's electrical activity while you go about your day. °· An exercise test. ° °How is this treated? °Treatment for this condition depends on the cause of the condition and how severe your symptoms are. Treatment may involve: °· Treatment of the underlying condition. °· Changing your medicines or how much medicine you take. °· Having a small, battery-operated device called a pacemaker implanted under the skin. When bradycardia occurs, this device can be used to increase your heart rate and help your heart to beat in a regular rhythm. ° °Follow these instructions at home: °Lifestyle ° °· Manage any health conditions that contribute to bradycardia as told by your health care provider. °· Follow a heart-healthy diet. A nutrition specialist (dietitian) can help to educate you about healthy food options and changes. °· Follow an exercise program that is approved by your health care provider. °· Maintain a healthy weight. °· Try to reduce or manage your stress, such as with yoga or meditation. If you need help reducing stress, ask your health care provider. °· Do not use use any products that contain nicotine or tobacco, such as cigarettes and e-cigarettes. If you need help quitting, ask your health care provider. °· Do not use illegal drugs. °· Limit alcohol intake to no more than 1 drink per day for nonpregnant women and 2 drinks per   day for men. One drink equals 12 oz of beer, 5 oz of wine, or 1½ oz of hard liquor. °General instructions °· Take over-the-counter and prescription medicines only as told by your health care provider. °· Keep all follow-up visits as directed by your health care provider. This is important. °How is this  prevented? °In some cases, bradycardia may be prevented by: °· Treating underlying medical problems. °· Stopping behaviors or medicines that can trigger the condition. ° °Contact a health care provider if: °· You feel light-headed or dizzy. °· You almost faint. °· You feel weak or are easily fatigued during physical activity. °· You experience confusion or have memory problems. °Get help right away if: °· You faint. °· You have an irregular heartbeat (palpitations). °· You have chest pain. °· You have trouble breathing. °This information is not intended to replace advice given to you by your health care provider. Make sure you discuss any questions you have with your health care provider. °Document Released: 11/16/2001 Document Revised: 10/23/2015 Document Reviewed: 08/16/2015 °Elsevier Interactive Patient Education © 2017 [employer] Inc. ° °

## 2017-12-17 NOTE — ED Provider Notes (Signed)
[facility] EMERGENCY DEPARTMENT Provider Note   CSN: [id number] Arrival date & time: 12/17/17  [place]     History   Chief Complaint Chief Complaint  Patient presents with  . Chest Pain    HPI Sumedh Shinsato is a 47 y.o. male with a prior history of asymptomatic bradycardia with full normal workup by Dr. Percival Spanish in 2016 (caught by his Fitbit).  He has purchased a new [employer] which he started wearing early September and it has started to give him alarms of bradycardia for more than 10 minute intervals, only while sleeping.  He was seen by his pcp today and was sent here for further evaluation.  He does endorse having occasional episodes for the past several weeks of midsternal "discomfort" occurring at rest and responds to antacids, typically. He was feeling this today but has resolved since arrival here without use of antacids.  He is on norvasc and bystolic for bp control, no recent change in dosing.  He denies palpitations, dizziness, diaphoresis, n/v.  He reports walking about 7-10 miles most days in an attempt at weight loss and denies sx with this activity.   The history is provided by the patient.    Past Medical History:  Diagnosis Date  . Arrhythmia    BRADYCARDIA/ 41-50 BEATS  . Hyperlipidemia   . Hypertension     Patient Active Problem List   Diagnosis Date Noted  . Essential hypertension 12/15/2013  . Hyperlipidemia with target LDL less than 100 12/15/2013    Past Surgical History:  Procedure Laterality Date  . RETINAL DETACHMENT SURGERY          Home Medications    Prior to Admission medications   Medication Sig Start Date End Date Taking? Authorizing Provider  acetaminophen (TYLENOL) 500 MG tablet Take 1,000 mg by mouth every 6 (six) hours as needed for mild pain or moderate pain.   Yes [provider]  amLODipine (NORVASC) 10 MG tablet Take 1 tablet (10 mg total) by mouth daily. 03/26/15  Yes Hassell Done, Mary-Margaret, FNP  aspirin 81 MG tablet  Take 81 mg by mouth daily.   Yes [provider]  BYSTOLIC 10 MG tablet TAKE 1 TABLET BY MOUTH  DAILY Patient taking differently: Take 10 mg by mouth every morning.  11/06/17  Yes Martin, Mary-Margaret, FNP  cholecalciferol (VITAMIN D) 1000 UNITS tablet Take 5,000 Units by mouth every morning.    Yes [provider]  fluticasone (FLONASE) 50 MCG/ACT nasal spray Place 2 sprays into both nostrils every morning.  06/23/14  Yes [provider]  Multiple Vitamin (MULTIVITAMIN) capsule Take 1 capsule by mouth every morning.    Yes [provider]  simvastatin (ZOCOR) 20 MG tablet TAKE 1 TABLET BY MOUTH  DAILY Patient taking differently: Take 20 mg by mouth every morning.  01/10/16  Yes Martin, Mary-Margaret, FNP  valsartan-hydrochlorothiazide (DIOVAN-HCT) 320-25 MG per tablet Take 1 tablet by mouth daily. Patient taking differently: Take 1 tablet by mouth every morning.  07/20/14  Yes Hassell Done Mary-Margaret, FNP    Family History Family History  Problem Relation Age of Onset  . CAD Mother 63  . CAD Father 31       CABG, died age 75    Social History Social History   Tobacco Use  . Smoking status: Never Smoker  . Smokeless tobacco: Never Used  Substance Use Topics  . Alcohol use: No  . Drug use: No     Allergies   Lipitor [atorvastatin]  Review of Systems Review of Systems  Constitutional: Negative for fatigue and fever.  HENT: Negative for congestion and sore throat.   Eyes: Negative.   Respiratory: Negative for shortness of breath.   Cardiovascular: Positive for chest pain. Negative for palpitations and leg swelling.  Gastrointestinal: Negative for abdominal pain, nausea and vomiting.  Genitourinary: Negative.   Musculoskeletal: Negative for arthralgias, joint swelling and neck pain.  Skin: Negative.  Negative for rash and wound.  Neurological: Negative for dizziness, syncope, weakness, light-headedness, numbness and headaches.    Psychiatric/Behavioral: Negative.      Physical Exam Updated Vital Signs BP (!) 175/100   Pulse (!) 50   Temp 98.2 F (36.8 C) (Oral)   Resp (!) 21   Ht 6\' 1"  (1.854 m)   Wt (!) 148.8 kg   SpO2 99%   BMI 43.27 kg/m   Physical Exam  Constitutional: He appears well-developed and well-nourished.  HENT:  Head: Normocephalic and atraumatic.  Eyes: Conjunctivae are normal.  Neck: Normal range of motion.  Cardiovascular: Normal rate, regular rhythm, normal heart sounds and intact distal pulses.  Pulmonary/Chest: Effort normal and breath sounds normal. He has no wheezes.  Abdominal: Soft. Bowel sounds are normal. There is no tenderness.  Musculoskeletal: Normal range of motion.       Right lower leg: He exhibits no edema.       Left lower leg: He exhibits no edema.  Neurological: He is alert.  Skin: Skin is warm and dry.  Psychiatric: He has a normal mood and affect.  Nursing note and vitals reviewed.    ED Treatments / Results  Labs (all labs ordered are listed, but only abnormal results are displayed) Labs Reviewed  BASIC METABOLIC PANEL - Abnormal; Notable for the following components:      Result Value   Glucose, Bld 110 (*)    Creatinine, Ser 1.26 (*)    All other components within normal limits  TROPONIN I - Abnormal; Notable for the following components:   Troponin I 0.03 (*)    All other components within normal limits  TROPONIN I - Abnormal; Notable for the following components:   Troponin I 0.03 (*)    All other components within normal limits  CBC    EKG None  Radiology Dg Chest 2 View  Result Date: 12/17/2017 CLINICAL DATA:  Chest for since 20/4 today. EXAM: CHEST - 2 VIEW COMPARISON:  None. FINDINGS: Heart size is top-normal. There is slight uncoiling of the thoracic aorta without aneurysmal dilatation. Mild elevation or eventration of the right hemidiaphragm is noted. No acute pulmonary consolidation, CHF, effusion or pneumothorax. No acute osseous  abnormality. IMPRESSION: No active cardiopulmonary disease. Electronically Signed   By: Ashley Royalty M.D.   On: 12/17/2017 17:39    Procedures Procedures (including critical care time)  Medications Ordered in ED Medications  aspirin chewable tablet 324 mg (324 mg Oral Given 12/17/17 1850)     Initial Impression / Assessment and Plan / ED Course  I have reviewed the triage vital signs and the nursing notes.  Pertinent labs & imaging results that were available during my care of the patient were reviewed by me and considered in my medical decision making (see chart for details).     Pt with sig risk factors for ACS including HTN, hypercholesterolemia and obesity with low heart score of 3.  Reasonable for outpatient close f/u.  Pt was seen by Dr Percival Spanish in 2016 for bradycardia at which time a stress  test was recommended when he lost some weight, but he gained instead so this never has happened. He would benefit by close outpatient f/u.  Discussed with Dr. Lenore Manner with cards and will advise Dr Percival Spanish of close outpt f/u.  Discussed with pt who is agreeable with plan.   Final Clinical Impressions(s) / ED Diagnoses   Final diagnoses:  History of bradycardia  Chest pain, unspecified type    ED Discharge Orders    None       Landis Martins 12/17/17 2202    Julianne Rice, MD 12/23/17 [phone]

## 2017-12-17 NOTE — Discharge Instructions (Addendum)
Your ekg's and lab tests tonight are reassuring but as discussed you will benefit by close followup with cardiology.

## 2017-12-17 NOTE — ED Notes (Signed)
Date and time results received: 12/17/17 1835   Test: Troponin Critical Value: 0.03  Name of Provider Notified: Evalee Jefferson  Orders Received? Or Actions Taken?: See chart

## 2017-12-17 NOTE — ED Notes (Signed)
Troponin result 0.03 given to J. Idol, EDPa.

## 2017-12-17 NOTE — Progress Notes (Signed)
Cardiology Moon[facility] Note  Spoke with ED provider. Patient presenting with asymptomatic bradycardia found on apple watch, as well as atypical, non-exertional chest discomfort for several weeks/months. Patient's provider requesting help scheduling patient with outpatient cardiology. Patient has seen Dr. Percival Spanish in the past. Will cc'd Dr. Percival Spanish on this note for assistance with scheduling follow up.   Marcie Mowers, MD Cardiology Fellow, PGY-6

## 2017-12-21 DIAGNOSIS — Z23 Encounter for immunization: Secondary | ICD-10-CM | POA: Diagnosis not present

## 2017-12-22 ENCOUNTER — Other Ambulatory Visit: Payer: Self-pay | Admitting: Family Medicine

## 2017-12-22 DIAGNOSIS — R079 Chest pain, unspecified: Secondary | ICD-10-CM

## 2017-12-22 DIAGNOSIS — R001 Bradycardia, unspecified: Secondary | ICD-10-CM

## 2017-12-27 NOTE — Progress Notes (Signed)
Cardiology Office Note   Date:  12/28/2017   ID:  Ian Stewart, DOB 10/11/1970, MRN [id number]  PCP:  Chevis Pretty, FNP  Cardiologist:   No primary care provider on file. Referring:  Chevis Pretty, FNP  Chief Complaint  Patient presents with  . Bradycardia       History of Present Illness: Ian Stewart is a 47 y.o. male who is referred by Chevis Pretty, FNP for evaluation of  bradycardia.  This was noted recently on his Apple Watch.  He was seen in the ED.  I reviewed these records for this visit.   There were no acute abnormalities and he was sent home.  He did mention chest discomfort during that meeting.  I do note that his troponin was 0.03.  He otherwise is felt well.  He might get some sharp discomfort occasionally.  However, he is walking 7 miles per day and is not having discomfort.  He is not having any acute shortness of breath, PND or orthopnea.  Despite the low heart rates recorded at night on his apple watch his heart rate goes up to 75 when he is walking.  He has no presyncope or syncope.  He has no shortness of breath, PND or orthopnea.  He has gained weight slowly over time.  I saw him in the past for evaluation of bradycardia.  It has been greater than three years since I last saw him.   He wore a Holter but did not have any prolonged pauses or symptomatic bradycardia.  Of note I sent him for a sleep study in 2016 and there was no significant evidence of sleep apnea.   Past Medical History:  Diagnosis Date  . Arrhythmia    BRADYCARDIA/ 41-50 BEATS  . Hyperlipidemia   . Hypertension     Past Surgical History:  Procedure Laterality Date  . RETINAL DETACHMENT SURGERY       Current Outpatient Medications  Medication Sig Dispense Refill  . acetaminophen (TYLENOL) 500 MG tablet Take 1,000 mg by mouth every 6 (six) hours as needed for mild pain or moderate pain.    Marland Kitchen amLODipine (NORVASC) 10 MG tablet Take 1 tablet (10 mg total) by mouth  daily. 30 tablet 3  . aspirin 81 MG tablet Take 81 mg by mouth daily.    Marland Kitchen BYSTOLIC 10 MG tablet TAKE 1 TABLET BY MOUTH  DAILY (Patient taking differently: Take 10 mg by mouth every morning. ) 90 tablet 0  . cholecalciferol (VITAMIN D) 1000 UNITS tablet Take 5,000 Units by mouth every morning.     . fluticasone (FLONASE) 50 MCG/ACT nasal spray Place 2 sprays into both nostrils every morning.     . Multiple Vitamin (MULTIVITAMIN) capsule Take 1 capsule by mouth every morning.     . simvastatin (ZOCOR) 20 MG tablet TAKE 1 TABLET BY MOUTH  DAILY (Patient taking differently: Take 20 mg by mouth every morning. ) 90 tablet 0  . valsartan-hydrochlorothiazide (DIOVAN-HCT) 320-25 MG per tablet Take 1 tablet by mouth daily. (Patient taking differently: Take 1 tablet by mouth every morning. ) 90 tablet 1  . spironolactone (ALDACTONE) 25 MG tablet Take 1 tablet (25 mg total) by mouth daily. 90 tablet 3   No current facility-administered medications for this visit.     Allergies:   Lipitor [atorvastatin]    Social History:  The patient  reports that he has never smoked. He has never used smokeless tobacco. He reports that he does not drink  alcohol or use drugs.   Family History:  The patient's family history includes CAD (age of onset: 83) in his father; CAD (age of onset: 53) in his mother.    ROS:  Please see the history of present illness.   Otherwise, review of systems are positive for none.   All other systems are reviewed and negative.    PHYSICAL EXAM: VS:  BP (!) 170/103   Pulse 60   Ht 6\' 1"  (1.854 m)   Wt (!) 326 lb 3.2 oz (148 kg)   SpO2 96%   BMI 43.04 kg/m  , BMI Body mass index is 43.04 kg/m. GENERAL:  Well appearing HEENT:  Pupils equal round and reactive, fundi not visualized, oral mucosa unremarkable NECK:  No jugular venous distention, waveform within normal limits, carotid upstroke brisk and symmetric, no bruits, no thyromegaly LYMPHATICS:  No cervical, inguinal  adenopathy LUNGS:  Clear to auscultation bilaterally BACK:  No CVA tenderness CHEST:  Unremarkable HEART:  PMI not displaced or sustained,S1 and S2 within normal limits, no S3, no S4, no clicks, no rubs, no murmurs ABD:  Flat, positive bowel sounds normal in frequency in pitch, no bruits, no rebound, no guarding, no midline pulsatile mass, no hepatomegaly, no splenomegaly EXT:  2 plus pulses throughout, no edema, no cyanosis no clubbing SKIN:  No rashes no nodules NEURO:  Cranial nerves II through XII grossly intact, motor grossly intact throughout PSYCH:  Cognitively intact, oriented to person place and time    EKG:  EKG is not ordered today. The ekg ordered 12/17/17 demonstrates sinus rhythm, rate 61, leftward axis, intervals within normal limits, nonspecific ST-T wave changes, baseline artifact precludes analysis.   Recent Labs: 12/17/2017: BUN 18; Creatinine, Ser 1.26; Hemoglobin 14.0; Platelets 247; Potassium 3.6; Sodium 140    Lipid Panel    Component Value Date/Time   CHOL 192 03/26/2015 1426   TRIG 255 (H) 03/26/2015 1426   TRIG 263 (H) 07/20/2014 1450   HDL 37 (L) 03/26/2015 1426   HDL 37 (L) 07/20/2014 1450   CHOLHDL 5.2 (H) 03/26/2015 1426   LDLCALC 104 (H) 03/26/2015 1426      Wt Readings from Last 3 Encounters:  12/28/17 (!) 326 lb 3.2 oz (148 kg)  12/17/17 (!) 328 lb (148.8 kg)  12/17/17 (!) 329 lb (149.2 kg)      Other studies Reviewed: Additional studies/ records that were reviewed today include: ED records, sleep study 2016. Review of the above records demonstrates:  Please see elsewhere in the note.     ASSESSMENT AND PLAN:  BRADYCARDIA:   He is having no symptoms related to this.  This is all nighttime bradycardia and he has normal chronotropic response to activity.  No further testing is indicated.  No change in therapy.  HTN:   His blood pressure is not controlled.  I am going to add Spironolactone 25 mg daily with a basic metabolic profile in 2  weeks.  Hopefully as he loses weight will be able to back off on his medications.   OVERWEIGHT:   We had a long discussion about this.  He is been up and down with his weight and he understands how to lose weight and is walking more and hopefully can achieve this.   ELEVATED TROPONIN: Given his atypical chest pain and borderline troponin I will bring him back for a POET (Plain Old Exercise Treadmill)  RISK REDUCTION: I did review some outside labs.  He is LDL was 101.  HDL  32.  Triglycerides 196.  A1c 5.6.  No change in therapy.   Current medicines are reviewed at length with the patient today.  The patient does not have concerns regarding medicines.  The following changes have been made:  As above  Labs/ tests ordered today include:   Orders Placed This Encounter  Procedures  . Basic Metabolic Panel (BMET)  . EXERCISE TOLERANCE TEST (ETT)     Disposition:   FU with me in two months.     Signed, Minus Breeding, MD  12/28/2017 10:05 AM    [place]

## 2017-12-28 ENCOUNTER — Encounter: Payer: Self-pay | Admitting: Cardiology

## 2017-12-28 ENCOUNTER — Ambulatory Visit: Payer: BLUE CROSS/BLUE SHIELD | Admitting: Cardiology

## 2017-12-28 VITALS — BP 170/103 | HR 60 | Ht 73.0 in | Wt 326.2 lb

## 2017-12-28 DIAGNOSIS — R001 Bradycardia, unspecified: Secondary | ICD-10-CM | POA: Diagnosis not present

## 2017-12-28 DIAGNOSIS — R778 Other specified abnormalities of plasma proteins: Secondary | ICD-10-CM

## 2017-12-28 DIAGNOSIS — R7989 Other specified abnormal findings of blood chemistry: Secondary | ICD-10-CM | POA: Diagnosis not present

## 2017-12-28 DIAGNOSIS — R079 Chest pain, unspecified: Secondary | ICD-10-CM | POA: Diagnosis not present

## 2017-12-28 DIAGNOSIS — I10 Essential (primary) hypertension: Secondary | ICD-10-CM

## 2017-12-28 DIAGNOSIS — E663 Overweight: Secondary | ICD-10-CM | POA: Diagnosis not present

## 2017-12-28 MED ORDER — SPIRONOLACTONE 25 MG PO TABS: 25.0000 mg | ORAL_TABLET | Freq: Every day | ORAL | 3 refills | Status: DC

## 2017-12-28 NOTE — Patient Instructions (Signed)
Medication Instructions:  START- Spironolactone 25 mg daily  If you need a refill on your cardiac medications before your next appointment, please call your pharmacy.  Labwork: BMP in 2 weeks HERE IN OUR OFFICE AT [facility]  Take the provided lab slips with you to the lab for your blood draw.   You will NOT need to fast   If you have labs (blood work) drawn today and your tests are completely normal, you will receive your results only by: Marland Kitchen MyChart Message (if you have MyChart) OR . A paper copy in the mail If you have any lab test that is abnormal or we need to change your treatment, we will call you to review the results.  Testing/Procedures: Your physician has requested that you have an exercise tolerance test. For further information please visit [email or web address]. Please also follow instruction sheet, as given.  Follow-Up: . You will need a follow up appointment in 2 Months.    At [facility], you and your health needs are our priority.  As part of our continuing mission to provide you with exceptional heart care, we have created designated Provider Care Teams.  These Care Teams include your primary Cardiologist (physician) and Advanced Practice Providers (APPs -  Physician Assistants and Nurse Practitioners) who all work together to provide you with the care you need, when you need it.   Thank you for choosing [facility] at [facility]!!

## 2017-12-31 ENCOUNTER — Telehealth (HOSPITAL_COMMUNITY): Payer: Self-pay

## 2017-12-31 NOTE — Telephone Encounter (Signed)
Encounter complete. 

## 2018-01-01 ENCOUNTER — Telehealth (HOSPITAL_COMMUNITY): Payer: Self-pay

## 2018-01-01 NOTE — Telephone Encounter (Signed)
Encounter complete. 

## 2018-01-04 DIAGNOSIS — I10 Essential (primary) hypertension: Secondary | ICD-10-CM | POA: Diagnosis not present

## 2018-01-05 ENCOUNTER — Ambulatory Visit (HOSPITAL_COMMUNITY)
Admission: RE | Admit: 2018-01-05 | Discharge: 2018-01-05 | Disposition: A | Discharge status: Home / Self Care | Payer: BLUE CROSS/BLUE SHIELD | Source: Ambulatory Visit | Attending: Cardiology | Admitting: Cardiology

## 2018-01-05 DIAGNOSIS — R7989 Other specified abnormal findings of blood chemistry: Secondary | ICD-10-CM

## 2018-01-05 DIAGNOSIS — R079 Chest pain, unspecified: Secondary | ICD-10-CM

## 2018-01-05 DIAGNOSIS — R778 Other specified abnormalities of plasma proteins: Secondary | ICD-10-CM

## 2018-01-05 LAB — EXERCISE TOLERANCE TEST
CHL CUP MPHR: 172 {beats}/min
CHL RATE OF PERCEIVED EXERTION: 19
CSEPED: 9 min
CSEPHR: 93 %
Estimated workload: 10.4 METS
Exercise duration (sec): 0 s
Peak HR: 160 {beats}/min
Rest HR: 64 {beats}/min

## 2018-01-11 DIAGNOSIS — R7989 Other specified abnormal findings of blood chemistry: Secondary | ICD-10-CM | POA: Diagnosis not present

## 2018-01-11 DIAGNOSIS — R079 Chest pain, unspecified: Secondary | ICD-10-CM | POA: Diagnosis not present

## 2018-01-11 LAB — BASIC METABOLIC PANEL
BUN / CREAT RATIO: 19 (ref 9–20)
BUN: 21 mg/dL (ref 6–24)
CO2: 23 mmol/L (ref 20–29)
CREATININE: 1.1 mg/dL (ref 0.76–1.27)
Calcium: 9.7 mg/dL (ref 8.7–10.2)
Chloride: 101 mmol/L (ref 96–106)
GFR calc Af Amer: 91 mL/min/{1.73_m2} (ref >59)
GFR, EST NON AFRICAN AMERICAN: 79 mL/min/{1.73_m2} (ref >59)
GLUCOSE: 101 mg/dL — AB (ref 65–99)
Potassium: 4 mmol/L (ref 3.5–5.2)
SODIUM: 140 mmol/L (ref 134–144)

## 2018-01-29 ENCOUNTER — Other Ambulatory Visit: Payer: Self-pay | Admitting: Nurse Practitioner

## 2018-01-29 DIAGNOSIS — I10 Essential (primary) hypertension: Secondary | ICD-10-CM

## 2018-02-21 NOTE — Progress Notes (Signed)
Cardiology Office Note   Date:  02/22/2018   ID:  Ian Stewart, DOB 1971-02-17, MRN [id number]  PCP:  Chevis Pretty, FNP  Cardiologist:   No primary care provider on file. Referring:  Chevis Pretty, FNP  No chief complaint on file.      History of Present Illness: Ian Stewart is a 47 y.o. male who is referred by Chevis Pretty, FNP for evaluation of  bradycardia.  This was noted recently on his Apple Watch.  He was seen in the ED in October for this prior to our last visit.  He had some chest pain and had a negative POET (Plain Old Exercise Treadmill) after the last visit.    Since I last saw him he is done well.  He is had no symptomatic bradycardia arrhythmias.  He denies any palpitations, presyncope or syncope.  He has had no chest pressure, neck or arm discomfort.  He has had no shortness of breath, PND or orthopnea.  Is not exercising as much as I would like.  He is tolerating the medicines as listed.  In particular he has had no symptoms associated with bradycardia arrhythmias.  He wears an apple watch and has not been having any alarms that fire.  Past Medical History:  Diagnosis Date  . Arrhythmia    BRADYCARDIA/ 41-50 BEATS  . Hyperlipidemia   . Hypertension     Past Surgical History:  Procedure Laterality Date  . RETINAL DETACHMENT SURGERY       Current Outpatient Medications  Medication Sig Dispense Refill  . acetaminophen (TYLENOL) 500 MG tablet Take 1,000 mg by mouth every 6 (six) hours as needed for mild pain or moderate pain.    Marland Kitchen amLODipine (NORVASC) 10 MG tablet Take 1 tablet (10 mg total) by mouth daily. 30 tablet 3  . aspirin 81 MG tablet Take 81 mg by mouth daily.    Marland Kitchen BYSTOLIC 10 MG tablet TAKE 1 TABLET BY MOUTH  DAILY 90 tablet 0  . cholecalciferol (VITAMIN D) 1000 UNITS tablet Take 5,000 Units by mouth every morning.     . fluticasone (FLONASE) 50 MCG/ACT nasal spray Place 2 sprays into both nostrils every morning.     .  Multiple Vitamin (MULTIVITAMIN) capsule Take 1 capsule by mouth every morning.     . simvastatin (ZOCOR) 20 MG tablet TAKE 1 TABLET BY MOUTH  DAILY (Patient taking differently: Take 20 mg by mouth every morning. ) 90 tablet 0  . spironolactone (ALDACTONE) 25 MG tablet Take 1 tablet (25 mg total) by mouth daily. 90 tablet 3  . valsartan-hydrochlorothiazide (DIOVAN-HCT) 320-25 MG per tablet Take 1 tablet by mouth daily. (Patient taking differently: Take 1 tablet by mouth every morning. ) 90 tablet 1   No current facility-administered medications for this visit.     Allergies:   Lipitor [atorvastatin]     ROS:  Please see the history of present illness.   Otherwise, review of systems are positive for none.   All other systems are reviewed and negative.    PHYSICAL EXAM: VS:  BP (!) 148/100   Pulse 68   Ht 6\' 1"  (1.854 m)   Wt (!) 328 lb 3.2 oz (148.9 kg)   BMI 43.30 kg/m  , BMI Body mass index is 43.3 kg/m. GENERAL:  Well appearing NECK:  No jugular venous distention, waveform within normal limits, carotid upstroke brisk and symmetric, no bruits, no thyromegaly LUNGS:  Clear to auscultation bilaterally CHEST:  Unremarkable HEART:  PMI not displaced or sustained,S1 and S2 within normal limits, no S3, no S4, no clicks, no rubs, no murmurs ABD:  Flat, positive bowel sounds normal in frequency in pitch, no bruits, no rebound, no guarding, no midline pulsatile mass, no hepatomegaly, no splenomegaly EXT:  2 plus pulses throughout, no edema, no cyanosis no clubbing   EKG:  EKG is not  ordered today.    Recent Labs: 12/17/2017: Hemoglobin 14.0; Platelets 247 01/11/2018: BUN 21; Creatinine, Ser 1.10; Potassium 4.0; Sodium 140    Lipid Panel    Component Value Date/Time   CHOL 192 03/26/2015 1426   TRIG 255 (H) 03/26/2015 1426   TRIG 263 (H) 07/20/2014 1450   HDL 37 (L) 03/26/2015 1426   HDL 37 (L) 07/20/2014 1450   CHOLHDL 5.2 (H) 03/26/2015 1426   LDLCALC 104 (H) 03/26/2015 1426       Wt Readings from Last 3 Encounters:  02/22/18 (!) 328 lb 3.2 oz (148.9 kg)  12/28/17 (!) 326 lb 3.2 oz (148 kg)  12/17/17 (!) 328 lb (148.8 kg)      Other studies Reviewed: Additional studies/ records that were reviewed today include: None Review of the above records demonstrates:     ASSESSMENT AND PLAN:  BRADYCARDIA:   He is having no symptoms related to this.  No change in therapy is indicated.  HTN:   His blood pressure is not at target.  He is going keep a blood pressure diary.  He will bring this back to the [facility] clinic and would also be happy to correlate his blood pressure cuff with our readings.  For now he will continue the meds as listed.   OVERWEIGHT:   We talked again about weight loss with diet and exercise and he has a plan for the beginning of the year.  ELEVATED TROPONIN:  He had a negative POET (Plain Old Exercise Treadmill) .  He has had no further symptoms.  There was no ischemia on his treadmill.  No further work-up is planned.     Current medicines are reviewed at length with the patient today.  The patient does not have concerns regarding medicines.  The following changes have been made:  None  Labs/ tests ordered today include:  None  No orders of the defined types were placed in this encounter.    Disposition:   FU with me in 12 months.     Signed, Minus Breeding, MD  02/22/2018 10:21 AM    [place]

## 2018-02-22 ENCOUNTER — Ambulatory Visit: Payer: BLUE CROSS/BLUE SHIELD | Admitting: Cardiology

## 2018-02-22 ENCOUNTER — Encounter: Payer: Self-pay | Admitting: Cardiology

## 2018-02-22 VITALS — BP 148/100 | HR 68 | Ht 73.0 in | Wt 328.2 lb

## 2018-02-22 DIAGNOSIS — Z6841 Body Mass Index (BMI) 40.0 and over, adult: Secondary | ICD-10-CM | POA: Insufficient documentation

## 2018-02-22 DIAGNOSIS — I10 Essential (primary) hypertension: Secondary | ICD-10-CM

## 2018-02-22 DIAGNOSIS — R001 Bradycardia, unspecified: Secondary | ICD-10-CM

## 2018-02-22 MED ORDER — SPIRONOLACTONE 25 MG PO TABS: 25.0000 mg | ORAL_TABLET | Freq: Every day | ORAL | 3 refills | Status: DC

## 2018-02-22 NOTE — Patient Instructions (Signed)
Medication Instructions:  Continue current medications  If you need a refill on your cardiac medications before your next appointment, please call your pharmacy.  Labwork: None Ordered   If you have labs (blood work) drawn today and your tests are completely normal, you will receive your results only by [employer] (if you have MyChart) -OR- A paper copy in the mail.  If you have any lab test that is abnormal or we need to change your treatment, we will call you to review these results.  Testing/Procedures: None Ordered  Follow-Up: You will need a follow up appointment in 12 months.  Please call our office 2 months in advance to schedule this appointment.  You may see Dr Percival Spanish or one of the following Advanced Practice Providers on your designated Care Team:   Rosaria Ferries, PA-C . Jory Sims, DNP, ANP   At [facility], you and your health needs are our priority.  As part of our continuing mission to provide you with exceptional heart care, we have created designated Provider Care Teams.  These Care Teams include your primary Cardiologist (physician) and Advanced Practice Providers (APPs -  Physician Assistants and Nurse Practitioners) who all work together to provide you with the care you need, when you need it.

## 2018-03-28 DIAGNOSIS — Z6841 Body Mass Index (BMI) 40.0 and over, adult: Secondary | ICD-10-CM | POA: Diagnosis not present

## 2018-03-28 DIAGNOSIS — S61210A Laceration without foreign body of right index finger without damage to nail, initial encounter: Secondary | ICD-10-CM | POA: Diagnosis not present

## 2018-04-05 ENCOUNTER — Telehealth: Payer: Self-pay | Admitting: Cardiology

## 2018-04-05 NOTE — Telephone Encounter (Signed)
New Message:     Pt said he received notice from his pharmacy, that the manufacture is out of his Diovan. They want to know if they can  replace that with something else or try another pharmacy.?

## 2018-04-05 NOTE — Telephone Encounter (Signed)
The out-of stock issues with valsartan are hit and miss.  I would suggest we wait until he is about 4 weeks from needing a refill to determine if this is still going to be an issue.

## 2018-04-05 NOTE — Telephone Encounter (Signed)
Called patient, he states that the note advised him to try to switch his medication as the Diovan was on backorder. He has to use a certain pharmacy with is insurance, so would rather not switch. I advised I could mention it to our pharmacist here to get their recommendations. Patient states he does have 2 months left of the current medication but does not want to wait and have issues closer to when his refill is needed.  Thanks!

## 2018-04-05 NOTE — Telephone Encounter (Signed)
Called patient, advised of note from PharmD.  Patient verbalized understanding.

## 2018-04-07 DIAGNOSIS — Z6841 Body Mass Index (BMI) 40.0 and over, adult: Secondary | ICD-10-CM | POA: Diagnosis not present

## 2018-04-07 DIAGNOSIS — Z4802 Encounter for removal of sutures: Secondary | ICD-10-CM | POA: Diagnosis not present

## 2018-04-07 DIAGNOSIS — M25562 Pain in left knee: Secondary | ICD-10-CM | POA: Diagnosis not present

## 2018-04-08 DIAGNOSIS — M25362 Other instability, left knee: Secondary | ICD-10-CM | POA: Diagnosis not present

## 2018-04-08 DIAGNOSIS — M25562 Pain in left knee: Secondary | ICD-10-CM | POA: Diagnosis not present

## 2018-04-22 DIAGNOSIS — M25562 Pain in left knee: Secondary | ICD-10-CM | POA: Diagnosis not present

## 2018-04-24 ENCOUNTER — Other Ambulatory Visit: Payer: Self-pay | Admitting: Nurse Practitioner

## 2018-04-24 DIAGNOSIS — I10 Essential (primary) hypertension: Secondary | ICD-10-CM

## 2018-04-28 DIAGNOSIS — I10 Essential (primary) hypertension: Secondary | ICD-10-CM | POA: Diagnosis not present

## 2018-04-28 DIAGNOSIS — R7309 Other abnormal glucose: Secondary | ICD-10-CM | POA: Diagnosis not present

## 2018-04-28 DIAGNOSIS — E669 Obesity, unspecified: Secondary | ICD-10-CM | POA: Diagnosis not present

## 2018-04-28 DIAGNOSIS — E785 Hyperlipidemia, unspecified: Secondary | ICD-10-CM | POA: Diagnosis not present

## 2018-04-28 DIAGNOSIS — J309 Allergic rhinitis, unspecified: Secondary | ICD-10-CM | POA: Diagnosis not present

## 2018-06-07 ENCOUNTER — Other Ambulatory Visit: Payer: Self-pay | Admitting: Nurse Practitioner

## 2018-06-07 DIAGNOSIS — I10 Essential (primary) hypertension: Secondary | ICD-10-CM

## 2018-09-27 DIAGNOSIS — I10 Essential (primary) hypertension: Secondary | ICD-10-CM | POA: Diagnosis not present

## 2018-09-27 DIAGNOSIS — R7309 Other abnormal glucose: Secondary | ICD-10-CM | POA: Diagnosis not present

## 2018-09-30 ENCOUNTER — Telehealth: Payer: Self-pay | Admitting: Nurse Practitioner

## 2018-09-30 NOTE — Telephone Encounter (Signed)
appt scheduled Pt notified 

## 2018-10-04 ENCOUNTER — Other Ambulatory Visit: Payer: Self-pay

## 2018-10-05 ENCOUNTER — Ambulatory Visit: Payer: BC Managed Care – PPO | Admitting: Nurse Practitioner

## 2018-10-05 ENCOUNTER — Encounter: Payer: Self-pay | Admitting: Nurse Practitioner

## 2018-10-05 VITALS — BP 130/87 | HR 57 | Temp 98.0°F | Ht 73.0 in | Wt 336.0 lb

## 2018-10-05 DIAGNOSIS — N1831 Chronic kidney disease, stage 3a: Secondary | ICD-10-CM

## 2018-10-05 DIAGNOSIS — N183 Chronic kidney disease, stage 3 (moderate): Secondary | ICD-10-CM | POA: Diagnosis not present

## 2018-10-05 DIAGNOSIS — E8881 Metabolic syndrome: Secondary | ICD-10-CM

## 2018-10-05 LAB — BAYER DCA HB A1C WAIVED: HB A1C (BAYER DCA - WAIVED): 5.7 % (ref <7.0)

## 2018-10-05 NOTE — Patient Instructions (Signed)

## 2018-10-05 NOTE — Progress Notes (Signed)
   Subjective:    Patient ID: Ian Stewart, male    DOB: 1970/05/19, 48 y.o.   MRN: [id number]   Chief Complaint: Discuss labs   HPI Patient had labs drawn at work [facility] )and they told him his hgba1c was <5.9 then 5.7 last 2 checks and that he needed to see his PCP. He denies feeling bad. Denies excessive thirst, hunger or urination. I am also concerned that his creatine is up to 1.49.    Review of Systems  Constitutional: Negative for activity change and appetite change.  HENT: Negative.   Eyes: Negative for pain.  Respiratory: Negative for shortness of breath.   Cardiovascular: Negative for chest pain, palpitations and leg swelling.  Gastrointestinal: Negative for abdominal pain.  Endocrine: Negative for polydipsia.  Genitourinary: Negative.   Skin: Negative for rash.  Neurological: Negative for dizziness, weakness and headaches.  Hematological: Does not bruise/bleed easily.  Psychiatric/Behavioral: Negative.   All other systems reviewed and are negative.      Objective:   Physical Exam Vitals signs and nursing note reviewed.  Constitutional:      Appearance: Normal appearance. He is well-developed.  HENT:     Head: Normocephalic.     Nose: Nose normal.  Eyes:     Pupils: Pupils are equal, round, and reactive to light.  Neck:     Musculoskeletal: Normal range of motion and neck supple.     Thyroid: No thyroid mass or thyromegaly.     Vascular: No carotid bruit or JVD.     Trachea: Phonation normal.  Cardiovascular:     Rate and Rhythm: Normal rate and regular rhythm.  Pulmonary:     Effort: Pulmonary effort is normal. No respiratory distress.     Breath sounds: Normal breath sounds.  Abdominal:     General: Bowel sounds are normal.     Palpations: Abdomen is soft.     Tenderness: There is no abdominal tenderness.  Musculoskeletal: Normal range of motion.  Lymphadenopathy:     Cervical: No cervical adenopathy.  Skin:    General: Skin is warm and dry.   Neurological:     Mental Status: He is alert and oriented to person, place, and time.  Psychiatric:        Behavior: Behavior normal.        Thought Content: Thought content normal.        Judgment: Judgment normal.    BP 130/87   Pulse (!) 57   Temp 98 F (36.7 C) (Oral)   Ht 6\' 1"  (1.854 m)   Wt (!) 336 lb (152.4 kg)   BMI 44.33 kg/m        Assessment & Plan:  Ian Stewart comes in today with chief complaint of Discuss labs   Diagnosis and orders addressed:  1. Metabolic syndrome Discussed carb counting- given blooklet sto help - Bayer DCA Hb A1c Waived  2. Chronic kidney disease (CKD) stage G3a/A1, moderately decreased glomerular filtration rate (GFR) between 45-59 mL/min/1.73 square meter and albuminuria creatinine ratio less than 30 mg/g (HCC) Will recheck labsin 1 month    Follow up plan: 1 month   [place], FNP

## 2018-10-06 DIAGNOSIS — M109 Gout, unspecified: Secondary | ICD-10-CM | POA: Diagnosis not present

## 2018-10-10 ENCOUNTER — Other Ambulatory Visit: Payer: Self-pay | Admitting: Nurse Practitioner

## 2018-10-10 DIAGNOSIS — I10 Essential (primary) hypertension: Secondary | ICD-10-CM

## 2018-11-04 ENCOUNTER — Other Ambulatory Visit: Payer: Self-pay

## 2018-11-05 ENCOUNTER — Encounter: Payer: Self-pay | Admitting: Nurse Practitioner

## 2018-11-05 ENCOUNTER — Ambulatory Visit: Payer: BC Managed Care – PPO | Admitting: Nurse Practitioner

## 2018-11-05 VITALS — BP 130/88 | HR 54 | Temp 97.5°F | Ht 73.0 in | Wt 334.0 lb

## 2018-11-05 DIAGNOSIS — N183 Chronic kidney disease, stage 3 (moderate): Secondary | ICD-10-CM | POA: Diagnosis not present

## 2018-11-05 DIAGNOSIS — N1831 Chronic kidney disease, stage 3a: Secondary | ICD-10-CM | POA: Insufficient documentation

## 2018-11-05 DIAGNOSIS — I10 Essential (primary) hypertension: Secondary | ICD-10-CM | POA: Diagnosis not present

## 2018-11-05 DIAGNOSIS — E785 Hyperlipidemia, unspecified: Secondary | ICD-10-CM | POA: Diagnosis not present

## 2018-11-05 LAB — CMP14+EGFR
ALT: 23 IU/L (ref 0–44)
AST: 28 IU/L (ref 0–40)
Albumin/Globulin Ratio: 2.5 — ABNORMAL HIGH (ref 1.2–2.2)
Albumin: 4.8 g/dL (ref 4.0–5.0)
Alkaline Phosphatase: 38 IU/L — ABNORMAL LOW (ref 39–117)
BUN/Creatinine Ratio: 19 (ref 9–20)
BUN: 27 mg/dL — ABNORMAL HIGH (ref 6–24)
Bilirubin Total: 0.4 mg/dL (ref 0.0–1.2)
CO2: 23 mmol/L (ref 20–29)
Calcium: 9.8 mg/dL (ref 8.7–10.2)
Chloride: 103 mmol/L (ref 96–106)
Creatinine, Ser: 1.4 mg/dL — ABNORMAL HIGH (ref 0.76–1.27)
GFR calc Af Amer: 68 mL/min/{1.73_m2} (ref >59)
GFR calc non Af Amer: 59 mL/min/{1.73_m2} — ABNORMAL LOW (ref >59)
Globulin, Total: 1.9 g/dL (ref 1.5–4.5)
Glucose: 119 mg/dL — ABNORMAL HIGH (ref 65–99)
Potassium: 4.5 mmol/L (ref 3.5–5.2)
Sodium: 141 mmol/L (ref 134–144)
Total Protein: 6.7 g/dL (ref 6.0–8.5)

## 2018-11-05 LAB — LIPID PANEL
Chol/HDL Ratio: 6 ratio — ABNORMAL HIGH (ref 0.0–5.0)
Cholesterol, Total: 181 mg/dL (ref 100–199)
HDL: 30 mg/dL — ABNORMAL LOW (ref >39)
LDL Calculated: 83 mg/dL (ref 0–99)
Triglycerides: 340 mg/dL — ABNORMAL HIGH (ref 0–149)
VLDL Cholesterol Cal: 68 mg/dL — ABNORMAL HIGH (ref 5–40)

## 2018-11-05 MED ORDER — NEBIVOLOL HCL 10 MG PO TABS: 10.0000 mg | ORAL_TABLET | Freq: Every day | ORAL | 1 refills | Status: DC

## 2018-11-05 MED ORDER — VALSARTAN-HYDROCHLOROTHIAZIDE 320-25 MG PO TABS: 1.0000 | ORAL_TABLET | Freq: Every day | ORAL | 1 refills | Status: DC

## 2018-11-05 MED ORDER — SIMVASTATIN 20 MG PO TABS: 20.0000 mg | ORAL_TABLET | Freq: Every day | ORAL | 0 refills | Status: DC

## 2018-11-05 MED ORDER — AMLODIPINE BESYLATE 10 MG PO TABS: 10.0000 mg | ORAL_TABLET | Freq: Every day | ORAL | 3 refills | Status: DC

## 2018-11-05 NOTE — Patient Instructions (Signed)

## 2018-11-05 NOTE — Progress Notes (Signed)
Subjective:    Patient ID: Ian Stewart, male    DOB: Jun 12, 1970, 48 y.o.   MRN: [id number]  Ian Stewart comes in today with chief complaint of Recheck kidney function   Diagnosis and orders addressed:  1. Essential hypertension No c/o chest pain,sob or headache. Does not check blood pressure at home. Has checked at work on occasion.  2. Hyperlipidemia with target LDL less than 100 Does not watch diet and does no exercise. Lab Results  Component Value Date   CHOL 192 03/26/2015   HDL 37 (L) 03/26/2015   LDLCALC 104 (H) 03/26/2015   TRIG 255 (H) 03/26/2015   CHOLHDL 5.2 (H) 03/26/2015     3. Chronic kidney disease (CKD) stage G3a/A1, moderately decreased glomerular filtration rate (GFR) between 45-59 mL/min/1.73 square meter and albuminuria creatinine ratio less than 30 mg/g (HCC) Needs labs rechecked today  4. Morbid obesity ([place]) No recent weight changes.        Review of Systems  Constitutional: Negative for activity change and appetite change.  HENT: Negative.   Eyes: Negative for pain.  Respiratory: Negative for shortness of breath.   Cardiovascular: Negative for chest pain, palpitations and leg swelling.  Gastrointestinal: Negative for abdominal pain.  Endocrine: Negative for polydipsia.  Genitourinary: Negative.   Skin: Negative for rash.  Neurological: Negative for dizziness, weakness and headaches.  Hematological: Does not bruise/bleed easily.  Psychiatric/Behavioral: Negative.   All other systems reviewed and are negative.      Objective:   Physical Exam Vitals signs and nursing note reviewed.  Constitutional:      Appearance: Normal appearance. He is well-developed.  HENT:     Head: Normocephalic.     Nose: Nose normal.  Eyes:     Pupils: Pupils are equal, round, and reactive to light.  Neck:     Musculoskeletal: Normal range of motion and neck supple.     Thyroid: No thyroid mass or thyromegaly.     Vascular: No carotid bruit or JVD.   Trachea: Phonation normal.  Cardiovascular:     Rate and Rhythm: Normal rate and regular rhythm.  Pulmonary:     Effort: Pulmonary effort is normal. No respiratory distress.     Breath sounds: Normal breath sounds.  Abdominal:     General: Bowel sounds are normal.     Palpations: Abdomen is soft.     Tenderness: There is no abdominal tenderness.  Musculoskeletal: Normal range of motion.  Lymphadenopathy:     Cervical: No cervical adenopathy.  Skin:    General: Skin is warm and dry.  Neurological:     Mental Status: He is alert and oriented to person, place, and time.  Psychiatric:        Behavior: Behavior normal.        Thought Content: Thought content normal.        Judgment: Judgment normal.    BP 130/88   Pulse (!) 54   Temp (!) 97.5 F (36.4 C) (Oral)   Ht 6' 1" (1.854 m)   Wt (!) 334 lb (151.5 kg)   BMI 44.07 kg/m         Assessment & Plan:  [name] comes in today with chief complaint of Recheck kidney function   Diagnosis and orders addressed:  1. Essential hypertension Low sodium diet - CMP14+EGFR - nebivolol (BYSTOLIC) 10 MG tablet; Take 1 tablet (10 mg total) by mouth daily.  Dispense: 90 tablet; Refill: 1 - amLODipine (NORVASC) 10 MG tablet; Take  1 tablet (10 mg total) by mouth daily.  Dispense: 30 tablet; Refill: 3 - valsartan-hydrochlorothiazide (DIOVAN-HCT) 320-25 MG tablet; Take 1 tablet by mouth daily.  Dispense: 90 tablet; Refill: 1  2. Hyperlipidemia with target LDL less than 100 Low fat det - Lipid panel - simvastatin (ZOCOR) 20 MG tablet; Take 1 tablet (20 mg total) by mouth daily.  Dispense: 90 tablet; Refill: 0  3. Chronic kidney disease (CKD) stage G3a/A1, moderately decreased glomerular filtration rate (GFR) between 45-59 mL/min/1.73 square meter and albuminuria creatinine ratio less than 30 mg/g (HCC) Labs pending  4. Morbid obesity ([place]) Discussed diet and exercise for person with BMI >25 Will recheck weight in 3-6 months     Labs pending Health Maintenance reviewed Diet and exercise encouraged  Follow up plan: 6 months   [name] Done, FNP

## 2018-11-08 MED ORDER — FENOFIBRATE 145 MG PO TABS: 145.0000 mg | ORAL_TABLET | Freq: Every day | ORAL | 1 refills | Status: DC

## 2018-11-08 NOTE — Addendum Note (Signed)
Addended by: Chevis Pretty on: 11/08/2018 03:30 PM   Modules accepted: Orders

## 2018-12-20 DIAGNOSIS — Z23 Encounter for immunization: Secondary | ICD-10-CM | POA: Diagnosis not present

## 2019-01-07 ENCOUNTER — Other Ambulatory Visit: Payer: Self-pay | Admitting: Cardiology

## 2019-01-26 ENCOUNTER — Other Ambulatory Visit: Payer: Self-pay | Admitting: Nurse Practitioner

## 2019-01-26 ENCOUNTER — Telehealth: Payer: Self-pay | Admitting: Cardiology

## 2019-01-26 ENCOUNTER — Other Ambulatory Visit: Payer: Self-pay

## 2019-01-26 ENCOUNTER — Telehealth: Payer: Self-pay | Admitting: *Deleted

## 2019-01-26 MED ORDER — SPIRONOLACTONE 25 MG PO TABS: 25.0000 mg | ORAL_TABLET | Freq: Every day | ORAL | 1 refills | Status: DC

## 2019-01-26 NOTE — Telephone Encounter (Signed)
Rx request sent to pharmacy.  

## 2019-01-26 NOTE — Telephone Encounter (Signed)
ERROR

## 2019-01-31 DIAGNOSIS — Z0000 Encounter for general adult medical examination without abnormal findings: Secondary | ICD-10-CM | POA: Diagnosis not present

## 2019-01-31 DIAGNOSIS — E785 Hyperlipidemia, unspecified: Secondary | ICD-10-CM | POA: Diagnosis not present

## 2019-01-31 DIAGNOSIS — I10 Essential (primary) hypertension: Secondary | ICD-10-CM | POA: Diagnosis not present

## 2019-01-31 DIAGNOSIS — E559 Vitamin D deficiency, unspecified: Secondary | ICD-10-CM | POA: Diagnosis not present

## 2019-01-31 DIAGNOSIS — R7309 Other abnormal glucose: Secondary | ICD-10-CM | POA: Diagnosis not present

## 2019-02-09 DIAGNOSIS — I10 Essential (primary) hypertension: Secondary | ICD-10-CM | POA: Diagnosis not present

## 2019-02-09 DIAGNOSIS — E559 Vitamin D deficiency, unspecified: Secondary | ICD-10-CM | POA: Diagnosis not present

## 2019-02-09 DIAGNOSIS — R7309 Other abnormal glucose: Secondary | ICD-10-CM | POA: Diagnosis not present

## 2019-02-09 DIAGNOSIS — E785 Hyperlipidemia, unspecified: Secondary | ICD-10-CM | POA: Diagnosis not present

## 2019-02-19 ENCOUNTER — Other Ambulatory Visit: Payer: Self-pay | Admitting: Cardiology

## 2019-02-21 NOTE — Telephone Encounter (Signed)
Rx request sent to pharmacy.  

## 2019-02-28 ENCOUNTER — Other Ambulatory Visit: Payer: Self-pay

## 2019-02-28 ENCOUNTER — Telehealth: Payer: Self-pay | Admitting: Nurse Practitioner

## 2019-02-28 DIAGNOSIS — Z1211 Encounter for screening for malignant neoplasm of colon: Secondary | ICD-10-CM

## 2019-02-28 NOTE — Telephone Encounter (Signed)
REFERRAL REQUEST Telephone Note  What type of referral do you need? colonoscopy  Have you been seen at our office for this problem? No routine (Advise that they will likely need an appointment with their PCP before a referral can be done)  Is there a particular doctor or location that you prefer? No preference  Patient notified that referrals can take up to a week or longer to process. If they haven't heard anything within a week they should call back and speak with the referral department.

## 2019-03-07 NOTE — Telephone Encounter (Signed)
Referral to GI made

## 2019-03-08 ENCOUNTER — Encounter: Payer: Self-pay | Admitting: Internal Medicine

## 2019-03-15 NOTE — Progress Notes (Signed)
Cardiology Office Note   Date:  03/17/2019   ID:  Ian Stewart, DOB [birth year], MRN [id number]  PCP:  Chevis Pretty, FNP  Cardiologist:  Dr.Hochrein  CC: Follow Up   History of Present Illness: Ian Stewart is a 49 y.o. male who presents for ongoing assessment and management of bradycardia. He was seen last by Dr. Percival Spanish on 02/22/2018. He has other history of hypertension, CKD Stage G3a/A1. and obesity. He was noted to have a negative POET.   He comes today without any new complaints He reports that his PCP has placed him on fenofibrate therapy and is following his labs. Most recent TG 261 with LDL of  109.  He denies chest pain, myalgia, DOE or fatigue.  He has some mild dependent edema which resolves when he gets off his feet after work.  He is in need of refills on spironolactone.   Past Medical History:  Diagnosis Date  . Arrhythmia    BRADYCARDIA/ 41-50 BEATS  . Hyperlipidemia   . Hypertension     Past Surgical History:  Procedure Laterality Date  . RETINAL DETACHMENT SURGERY       Current Outpatient Medications  Medication Sig Dispense Refill  . acetaminophen (TYLENOL) 500 MG tablet Take 1,000 mg by mouth every 6 (six) hours as needed for mild pain or moderate pain.    Marland Kitchen amLODipine (NORVASC) 10 MG tablet Take 1 tablet (10 mg total) by mouth daily. 30 tablet 3  . aspirin 81 MG tablet Take 81 mg by mouth daily.    . cholecalciferol (VITAMIN D) 1000 UNITS tablet Take 1,000 Units by mouth every morning.     . fenofibrate (TRICOR) 145 MG tablet TAKE 1 TABLET BY MOUTH  DAILY 90 tablet 0  . fluticasone (FLONASE) 50 MCG/ACT nasal spray Place 2 sprays into both nostrils every morning.     . Multiple Vitamin (MULTIVITAMIN) capsule Take 1 capsule by mouth every morning.     . nebivolol (BYSTOLIC) 10 MG tablet Take 1 tablet (10 mg total) by mouth daily. 90 tablet 1  . simvastatin (ZOCOR) 20 MG tablet Take 1 tablet (20 mg total) by mouth daily. 90 tablet 0  . spironolactone  (ALDACTONE) 25 MG tablet Take 1 tablet (25 mg total) by mouth daily. 90 tablet 3  . valsartan-hydrochlorothiazide (DIOVAN-HCT) 320-25 MG tablet Take 1 tablet by mouth daily. 90 tablet 1   No current facility-administered medications for this visit.    Allergies:   Lipitor [atorvastatin]    Social History:  The patient  reports that he has never smoked. He has never used smokeless tobacco. He reports that he does not drink alcohol or use drugs.   Family History:  The patient's family history includes CAD (age of onset: 52) in his father; CAD (age of onset: 60) in his mother.    ROS: All other systems are reviewed and negative. Unless otherwise mentioned in H&P    PHYSICAL EXAM: VS:  BP (!) 141/90   Pulse 69   Temp (!) 97.1 F (36.2 C)   Ht 6\' 1"  (1.854 m)   Wt (!) 335 lb 12.8 oz (152.3 kg)   SpO2 97%   BMI 44.30 kg/m  , BMI Body mass index is 44.3 kg/m. GEN: Well nourished, well developed, in no acute distress.Morbidly obese HEENT: normal Neck: no JVD, carotid bruits, or masses Cardiac: RRR, distant heart sounds; no murmurs, rubs, or gallops,no edema  Respiratory:  Clear to auscultation bilaterally, normal work of breathing GI: soft, nontender,  nondistended, + BS MS: no deformity or atrophy Skin: warm and dry, no rash Neuro:  Strength and sensation are intact Psych: euthymic mood, full affect   EKG:  Personally reviewed. SR with occasional PVC. HR 69 bpm. Some non-specific T wave abnormalities. No change compared to 12/18/2017.   Recent Labs: 11/05/2018: ALT 23; BUN 27; Creatinine, Ser 1.40; Potassium 4.5; Sodium 141    Lipid Panel    Component Value Date/Time   CHOL 181 11/05/2018 0908   TRIG 340 (H) 11/05/2018 0908   TRIG 263 (H) 07/20/2014 1450   HDL 30 (L) 11/05/2018 0908   HDL 37 (L) 07/20/2014 1450   CHOLHDL 6.0 (H) 11/05/2018 0908   LDLCALC 83 11/05/2018 0908      Wt Readings from Last 3 Encounters:  03/16/19 (!) 335 lb 12.8 oz (152.3 kg)  11/05/18  (!) 334 lb (151.5 kg)  10/05/18 (!) 336 lb (152.4 kg)      Other studies Reviewed: POET 01/05/2018  9 minutes of exercise with fatigue and shortness of breath.  Nonspecific ST-T wave changes were noted at rest and there was no significant change upon stress. T wave inversions are noted in inferior leads as well as precordial lateral leads  When compared to baseline ECG, there are no accentuation of ST segment depression or accentuation of T wave inversions noted during stress. Overall results appear to be negative for ischemia.   ASSESSMENT AND PLAN:  1. Hypertension: BP is elevated today, but usually lower per previous office visits. He is medically compliant. Due to obesity, and hypertension, I will have an Echocardiogram completed for baseline information to assess LV function, diastolic dysfunction and LVH which will help with medication management. Due to body size and abdominal girth, Definity may be used for better images. He is given refill on spironolactone. Follow up labs per PCP   2. Bradycardia. HR is WNL per EKG. No symptoms   3. Mixed hyperlipidemia: Followed by PCP on fenofibrate and simvastatin. Labs are pending in February. May need to be referred to our [facility] with Dr. Debara Pickett if labs remain abnormal.    4. Obesity: Recommend calorie reduction and increased exercise for weight loss and reduction of CVRF.    Current medicines are reviewed at length with the patient today.    Labs/ tests ordered today include: None  Phill Myron. West Pugh, ANP, AACC   03/17/2019 9:02 AM    [address] Office [phone] Fax [phone]  Notice: This dictation was prepared with Dragon dictation along with smaller phrase technology. Any transcriptional errors that result from this process are unintentional and may not be corrected upon review.

## 2019-03-16 ENCOUNTER — Other Ambulatory Visit: Payer: Self-pay | Admitting: Nurse Practitioner

## 2019-03-16 ENCOUNTER — Other Ambulatory Visit: Payer: Self-pay

## 2019-03-16 ENCOUNTER — Encounter: Payer: Self-pay | Admitting: Adult Health

## 2019-03-16 ENCOUNTER — Ambulatory Visit: Payer: BLUE CROSS/BLUE SHIELD | Admitting: Adult Health

## 2019-03-16 VITALS — BP 141/90 | HR 69 | Temp 97.1°F | Ht 73.0 in | Wt 335.8 lb

## 2019-03-16 DIAGNOSIS — R001 Bradycardia, unspecified: Secondary | ICD-10-CM

## 2019-03-16 DIAGNOSIS — I519 Heart disease, unspecified: Secondary | ICD-10-CM

## 2019-03-16 DIAGNOSIS — I10 Essential (primary) hypertension: Secondary | ICD-10-CM | POA: Diagnosis not present

## 2019-03-16 DIAGNOSIS — E782 Mixed hyperlipidemia: Secondary | ICD-10-CM

## 2019-03-16 MED ORDER — SPIRONOLACTONE 25 MG PO TABS: 25.0000 mg | ORAL_TABLET | Freq: Every day | ORAL | 3 refills | Status: DC

## 2019-03-16 NOTE — Patient Instructions (Signed)
Medication Instructions:  Continue current medications  *If you need a refill on your cardiac medications before your next appointment, please call your pharmacy*  Lab Work: None Ordered  If you have labs (blood work) drawn today and your tests are completely normal, you will receive your results only by: Marland Kitchen MyChart Message (if you have MyChart) OR . A paper copy in the mail If you have any lab test that is abnormal or we need to change your treatment, we will call you to review the results.  Testing/Procedures: Your physician has requested that you have an echocardiogram. Echocardiography is a painless test that uses sound waves to create images of your heart. It provides your doctor with information about the size and shape of your heart and how well your heart's chambers and valves are working. This procedure takes approximately one hour. There are no restrictions for this procedure.  Follow-Up: At [facility], you and your health needs are our priority.  As part of our continuing mission to provide you with exceptional heart care, we have created designated Provider Care Teams.  These Care Teams include your primary Cardiologist (physician) and Advanced Practice Providers (APPs -  Physician Assistants and Nurse Practitioners) who all work together to provide you with the care you need, when you need it.  Your next appointment:   1 year(s)   The format for your next appointment:   In Person  Provider:   Minus Breeding, MD  Other Instructions

## 2019-03-17 ENCOUNTER — Other Ambulatory Visit: Payer: Self-pay | Admitting: Adult Health

## 2019-03-17 ENCOUNTER — Encounter: Payer: Self-pay | Admitting: Adult Health

## 2019-03-17 MED ORDER — SPIRONOLACTONE 25 MG PO TABS: 25.0000 mg | ORAL_TABLET | Freq: Every day | ORAL | 3 refills | Status: DC

## 2019-03-17 NOTE — Telephone Encounter (Signed)
Pt calling requesting that his medication be resent to [facility] mail order pharmacy. Medication sent. Confirmation received.

## 2019-03-18 ENCOUNTER — Other Ambulatory Visit (HOSPITAL_COMMUNITY): Payer: BC Managed Care – PPO

## 2019-03-22 ENCOUNTER — Encounter: Payer: Self-pay | Admitting: Family Medicine

## 2019-03-22 ENCOUNTER — Ambulatory Visit (INDEPENDENT_AMBULATORY_CARE_PROVIDER_SITE_OTHER): Payer: BC Managed Care – PPO | Admitting: Family Medicine

## 2019-03-22 DIAGNOSIS — R109 Unspecified abdominal pain: Secondary | ICD-10-CM

## 2019-03-22 DIAGNOSIS — R197 Diarrhea, unspecified: Secondary | ICD-10-CM

## 2019-03-22 DIAGNOSIS — R112 Nausea with vomiting, unspecified: Secondary | ICD-10-CM | POA: Diagnosis not present

## 2019-03-22 MED ORDER — AMOXICILLIN-POT CLAVULANATE 875-125 MG PO TABS: 1.0000 | ORAL_TABLET | Freq: Three times a day (TID) | ORAL | 0 refills | Status: AC

## 2019-03-22 NOTE — Patient Instructions (Signed)

## 2019-03-22 NOTE — Progress Notes (Signed)
Telephone visit  Subjective: CC: side pain PCP: Chevis Pretty, FNP Ian Stewart is a 49 y.o. male calls for telephone consult today. Patient provides verbal consent for consult held via phone.  Due to COVID-19 pandemic this visit was conducted virtually. This visit type was conducted due to national recommendations for restrictions regarding the COVID-19 Pandemic (e.g. social distancing, sheltering in place) in an effort to limit this patient's exposure and mitigate transmission in our community. All issues noted in this document were discussed and addressed.  A physical exam was not performed with this format.   Location of patient: home Location of provider: Working remotely from home Others present for call: none  1. Side pain Patient reports onset of left sided, periumbilical pain Sunday pm.  He reports associated stomach cramping and several episodes of non-bloody loose diarrhea that started yesterday.  Pain got worse yesterday.  He reports scant nausea but no vomiting.  No fevers.  Denies consumption of undercooked food, untreated water.  No known sick contacts.   He has not taken anything for symptoms.  He has colonoscopy scheduled for February.  He has been pushing fluids and taking tylenol.  He feels somewhat better today.   ROS: Per HPI  Allergies  Allergen Reactions  . Lipitor [Atorvastatin] Other (See Comments)    Muscle aches   Past Medical History:  Diagnosis Date  . Arrhythmia    BRADYCARDIA/ 41-50 BEATS  . Hyperlipidemia   . Hypertension     Current Outpatient Medications:  .  acetaminophen (TYLENOL) 500 MG tablet, Take 1,000 mg by mouth every 6 (six) hours as needed for mild pain or moderate pain., Disp: , Rfl:  .  amLODipine (NORVASC) 10 MG tablet, Take 1 tablet (10 mg total) by mouth daily., Disp: 30 tablet, Rfl: 3 .  aspirin 81 MG tablet, Take 81 mg by mouth daily., Disp: , Rfl:  .  BYSTOLIC 10 MG tablet, TAKE 1 TABLET BY MOUTH  DAILY, Disp: 90  tablet, Rfl: 0 .  cholecalciferol (VITAMIN D) 1000 UNITS tablet, Take 1,000 Units by mouth every morning. , Disp: , Rfl:  .  fenofibrate (TRICOR) 145 MG tablet, TAKE 1 TABLET BY MOUTH  DAILY, Disp: 90 tablet, Rfl: 0 .  fluticasone (FLONASE) 50 MCG/ACT nasal spray, Place 2 sprays into both nostrils every morning. , Disp: , Rfl:  .  Multiple Vitamin (MULTIVITAMIN) capsule, Take 1 capsule by mouth every morning. , Disp: , Rfl:  .  simvastatin (ZOCOR) 20 MG tablet, Take 1 tablet (20 mg total) by mouth daily., Disp: 90 tablet, Rfl: 0 .  spironolactone (ALDACTONE) 25 MG tablet, Take 1 tablet (25 mg total) by mouth daily., Disp: 90 tablet, Rfl: 3 .  valsartan-hydrochlorothiazide (DIOVAN-HCT) 320-25 MG tablet, Take 1 tablet by mouth daily., Disp: 90 tablet, Rfl: 1  Assessment/ Plan: 48 y.o. male   1. Nausea, vomiting, and diarrhea We will empirically treat for presumed diverticulitis.  He is demonstrating many other symptoms and signs suggestive of this but has no formal diagnosis of previous diverticulitis or diverticulosis.  I am going to place him on Augmentin 3 times daily for 7 days.  We discussed that if symptoms were worsening or not improving as expected he is to seek in person evaluation.  Continue to push oral fluids.  Nausea has resolved and therefore he declined antinausea medicine.  He will follow-up as needed. - amoxicillin-clavulanate (AUGMENTIN) 875-125 MG tablet; Take 1 tablet by mouth 3 (three) times daily for 7 days.  Dispense:  21 tablet; Refill: 0  2. Left sided abdominal pain - amoxicillin-clavulanate (AUGMENTIN) 875-125 MG tablet; Take 1 tablet by mouth 3 (three) times daily for 7 days.  Dispense: 21 tablet; Refill: 0   Start time: 8:49am End time: 8:58am  Total time spent on patient care (including telephone call/ virtual visit): 19 minutes  [place], [place] [phone]

## 2019-03-25 ENCOUNTER — Ambulatory Visit (HOSPITAL_COMMUNITY): Payer: BC Managed Care – PPO | Attending: Cardiovascular Disease

## 2019-03-25 ENCOUNTER — Other Ambulatory Visit: Payer: Self-pay

## 2019-03-25 DIAGNOSIS — I10 Essential (primary) hypertension: Secondary | ICD-10-CM

## 2019-03-25 DIAGNOSIS — I519 Heart disease, unspecified: Secondary | ICD-10-CM | POA: Diagnosis not present

## 2019-04-15 ENCOUNTER — Telehealth: Payer: Self-pay | Admitting: Internal Medicine

## 2019-04-15 ENCOUNTER — Other Ambulatory Visit: Payer: Self-pay

## 2019-04-15 ENCOUNTER — Ambulatory Visit (AMBULATORY_SURGERY_CENTER): Payer: Self-pay | Admitting: *Deleted

## 2019-04-15 VITALS — Temp 97.7°F | Ht 73.0 in | Wt 331.0 lb

## 2019-04-15 DIAGNOSIS — Z1211 Encounter for screening for malignant neoplasm of colon: Secondary | ICD-10-CM

## 2019-04-15 DIAGNOSIS — Z0289 Encounter for other administrative examinations: Secondary | ICD-10-CM

## 2019-04-15 MED ORDER — NA SULFATE-K SULFATE-MG SULF 17.5-3.13-1.6 GM/177ML PO SOLN: 1.0000 | Freq: Once | ORAL | 0 refills | Status: AC

## 2019-04-15 NOTE — Progress Notes (Signed)

## 2019-04-15 NOTE — Telephone Encounter (Signed)
Returned pt's call, states his copay is $100 for suprep, states his pharmacist said they would also apply the coupon and the cost would decrease, offered pt sample of plenvu but told him his instructions would change and he would need to come in and pick it up, pt states he will stay with suprep and he is ok with that.

## 2019-04-15 NOTE — Telephone Encounter (Signed)
Pt stated that insurance is not covering prep soln.

## 2019-04-16 ENCOUNTER — Other Ambulatory Visit: Payer: Self-pay | Admitting: Nurse Practitioner

## 2019-04-27 ENCOUNTER — Ambulatory Visit (INDEPENDENT_AMBULATORY_CARE_PROVIDER_SITE_OTHER): Payer: BC Managed Care – PPO

## 2019-04-27 ENCOUNTER — Other Ambulatory Visit: Payer: Self-pay | Admitting: Internal Medicine

## 2019-04-27 DIAGNOSIS — Z1159 Encounter for screening for other viral diseases: Secondary | ICD-10-CM

## 2019-04-27 LAB — SARS CORONAVIRUS 2 (TAT 6-24 HRS): SARS Coronavirus 2: NEGATIVE

## 2019-04-28 ENCOUNTER — Encounter: Payer: Self-pay | Admitting: Internal Medicine

## 2019-04-29 ENCOUNTER — Other Ambulatory Visit: Payer: Self-pay

## 2019-04-29 ENCOUNTER — Encounter: Payer: Self-pay | Admitting: Internal Medicine

## 2019-04-29 ENCOUNTER — Encounter: Payer: BLUE CROSS/BLUE SHIELD | Admitting: Internal Medicine

## 2019-04-29 ENCOUNTER — Ambulatory Visit (AMBULATORY_SURGERY_CENTER): Payer: BC Managed Care – PPO | Admitting: Internal Medicine

## 2019-04-29 VITALS — BP 135/83 | HR 64 | Temp 97.5°F | Resp 17 | Ht 73.0 in | Wt 331.0 lb

## 2019-04-29 DIAGNOSIS — Z1211 Encounter for screening for malignant neoplasm of colon: Secondary | ICD-10-CM

## 2019-04-29 DIAGNOSIS — D122 Benign neoplasm of ascending colon: Secondary | ICD-10-CM | POA: Diagnosis not present

## 2019-04-29 MED ORDER — SODIUM CHLORIDE 0.9 % IV SOLN: 500.0000 mL | Freq: Once | INTRAVENOUS | Status: DC

## 2019-04-29 NOTE — Op Note (Signed)
[place] Patient Name: Ian Stewart Procedure Date: 04/29/2019 12:56 PM MRN: [id number] Endoscopist: Docia Chuck. Henrene Pastor , MD Age: 49 Referring MD:  Date of Birth: Nov 23, 1970 Gender: Male Account #: [id number] Procedure:                Colonoscopy with cold snare polypectomy x 2 Indications:              Screening for colorectal malignant neoplasm Medicines:                Monitored Anesthesia Care Procedure:                Pre-Anesthesia Assessment:                           - Prior to the procedure, a History and Physical                            was performed, and patient medications and                            allergies were reviewed. The patient's tolerance of                            previous anesthesia was also reviewed. The risks                            and benefits of the procedure and the sedation                            options and risks were discussed with the patient.                            All questions were answered, and informed consent                            was obtained. Prior Anticoagulants: The patient has                            taken no previous anticoagulant or antiplatelet                            agents. ASA Grade Assessment: II - A patient with                            mild systemic disease. After reviewing the risks                            and benefits, the patient was deemed in                            satisfactory condition to undergo the procedure.                           After obtaining informed consent, the colonoscope  was passed under direct vision. Throughout the                            procedure, the patient's blood pressure, pulse, and                            oxygen saturations were monitored continuously. The                            Colonoscope was introduced through the anus and                            advanced to the the cecum, identified by   appendiceal orifice and ileocecal valve. The                            ileocecal valve, appendiceal orifice, and rectum                            were photographed. The quality of the bowel                            preparation was excellent. The colonoscopy was                            performed without difficulty. The patient tolerated                            the procedure well. The bowel preparation used was                            SUPREP via split dose instruction. Scope In: 1:03:53 PM Scope Out: 1:29:01 PM Scope Withdrawal Time: 0 hours 8 minutes 57 seconds  Total Procedure Duration: 0 hours 25 minutes 8 seconds  Findings:                 1 mm and 4 mm polyps were found in the ascending                            colon. The polyp was sessile. The polyp was removed                            with a cold snare. Resection and retrieval were                            complete.                           Multiple small and large-mouthed diverticula were                            found in the left colon and right colon.                           The exam  was otherwise without abnormality on                            direct and retroflexion views. The colon was quite                            redundant. Complications:            No immediate complications. Estimated blood loss:                            None. Estimated Blood Loss:     Estimated blood loss: none. Impression:               - 2 small polyps in the ascending colon, removed                            with a cold snare. Resected and retrieved.                           - Diverticulosis in the left colon and in the right                            colon.                           - The examination was otherwise normal on direct                            and retroflexion views.                           - Redundant colon Recommendation:           - Repeat colonoscopy in 10 years for surveillance.                            - Patient has a contact number available for                            emergencies. The signs and symptoms of potential                            delayed complications were discussed with the                            patient. Return to normal activities tomorrow.                            Written discharge instructions were provided to the                            patient.                           - Resume previous diet.                           -  Continue present medications.                           - Await pathology results. Docia Chuck. Henrene Pastor, MD 04/29/2019 1:35:07 PM This report has been signed electronically.

## 2019-04-29 NOTE — Progress Notes (Signed)
Called to room to assist during endoscopic procedure.  Patient ID and intended procedure confirmed with present staff. Received instructions for my participation in the procedure from the performing physician.  

## 2019-04-29 NOTE — Progress Notes (Signed)
Report to PACU, RN, vss, BBS= Clear.  

## 2019-04-29 NOTE — Patient Instructions (Signed)
Handout on polyps and diverticulosis   YOU HAD AN ENDOSCOPIC PROCEDURE TODAY AT [facility]:   Refer to the procedure report that was given to you for any specific questions about what was found during the examination.  If the procedure report does not answer your questions, please call your gastroenterologist to clarify.  If you requested that your care partner not be given the details of your procedure findings, then the procedure report has been included in a sealed envelope for you to review at your convenience later.  YOU SHOULD EXPECT: Some feelings of bloating in the abdomen. Passage of more gas than usual.  Walking can help get rid of the air that was put into your GI tract during the procedure and reduce the bloating. If you had a lower endoscopy (such as a colonoscopy or flexible sigmoidoscopy) you may notice spotting of blood in your stool or on the toilet paper. If you underwent a bowel prep for your procedure, you may not have a normal bowel movement for a few days.  Please Note:  You might notice some irritation and congestion in your nose or some drainage.  This is from the oxygen used during your procedure.  There is no need for concern and it should clear up in a day or so.  SYMPTOMS TO REPORT IMMEDIATELY:   Following lower endoscopy (colonoscopy or flexible sigmoidoscopy):  Excessive amounts of blood in the stool  Significant tenderness or worsening of abdominal pains  Swelling of the abdomen that is new, acute  Fever of 100F or higher  For urgent or emergent issues, a gastroenterologist can be reached at any hour by calling [phone].   DIET:  We do recommend a small meal at first, but then you may proceed to your regular diet.  Drink plenty of fluids but you should avoid alcoholic beverages for 24 hours.  ACTIVITY:  You should plan to take it easy for the rest of today and you should NOT DRIVE or use heavy machinery until tomorrow (because of the  sedation medicines used during the test).    FOLLOW UP: Our staff will call the number listed on your records 48-72 hours following your procedure to check on you and address any questions or concerns that you may have regarding the information given to you following your procedure. If we do not reach you, we will leave a message.  We will attempt to reach you two times.  During this call, we will ask if you have developed any symptoms of COVID 19. If you develop any symptoms (ie: fever, flu-like symptoms, shortness of breath, cough etc.) before then, please call [phone].  If you test positive for Covid 19 in the 2 weeks post procedure, please call and report this information to [place].    If any biopsies were taken you will be contacted by phone or by letter within the next 1-3 weeks.  Please call us at [phone] if you have not heard about the biopsies in 3 weeks.    SIGNATURES/CONFIDENTIALITY: You and/or your care partner have signed paperwork which will be entered into your electronic medical record.  These signatures attest to the fact that that the information above on your After Visit Summary has been reviewed and is understood.  Full responsibility of the confidentiality of this discharge information lies with you and/or your care-partner.

## 2019-04-29 NOTE — Progress Notes (Signed)
Pt. Reports no change in his medical or surgical history since his pre-visit 04/15/2019.

## 2019-05-02 ENCOUNTER — Telehealth: Payer: Self-pay

## 2019-05-02 NOTE — Telephone Encounter (Signed)
  Follow up Call-  Call back number 04/29/2019  Post procedure Call Back phone  # [phone]  Permission to leave phone message Yes  Some recent data might be hidden     Patient questions:  Do you have a fever, pain , or abdominal swelling? No. Pain Score  0 *  Have you tolerated food without any problems? Yes.    Have you been able to return to your normal activities? Yes.    Do you have any questions about your discharge instructions: Diet   No. Medications  No. Follow up visit  No.  Do you have questions or concerns about your Care? No.  Actions: * If pain score is 4 or above: No action needed, pain <4.  1. Have you developed a fever since your procedure? no  2.   Have you had an respiratory symptoms (SOB or cough) since your procedure? no  3.   Have you tested positive for COVID 19 since your procedure no  4.   Have you had any family members/close contacts diagnosed with the COVID 19 since your procedure?  no   If yes to any of these questions please route to Joylene John, RN and Alphonsa Gin, Therapist, sports.

## 2019-05-03 ENCOUNTER — Encounter: Payer: Self-pay | Admitting: Internal Medicine

## 2019-05-10 ENCOUNTER — Telehealth: Payer: Self-pay | Admitting: Nurse Practitioner

## 2019-05-10 NOTE — Telephone Encounter (Signed)
Please review message and advise on antibiotic request.

## 2019-05-10 NOTE — Telephone Encounter (Signed)
  Medication Request  05/10/2019  What is the name of the medication? Antibiotic  Have you contacted your pharmacy to request a refill? No  Which pharmacy would you like this sent to?  [facility], Ian Stewart says he had a televisit with Dr Lajuana Ripple in [place] and was prescribed an antibiotic to help with Diverticulitis. Wants to know if another antibiotic can be sent to pharmacy for him because he is having same issues as last time.    Patient notified that their request is being sent to the clinical staff for review and that they should receive a call once it is complete. If they do not receive a call within 24 hours they can check with their pharmacy or our office.

## 2019-05-12 NOTE — Telephone Encounter (Signed)
Cannot do refills on antibiotics without being seen

## 2019-05-12 NOTE — Telephone Encounter (Signed)
Pt aware - is now feeling better

## 2019-05-18 ENCOUNTER — Other Ambulatory Visit: Payer: Self-pay

## 2019-05-19 ENCOUNTER — Ambulatory Visit: Payer: BC Managed Care – PPO | Admitting: Nurse Practitioner

## 2019-05-19 ENCOUNTER — Other Ambulatory Visit: Payer: Self-pay | Admitting: Nurse Practitioner

## 2019-05-19 ENCOUNTER — Ambulatory Visit: Payer: BC Managed Care – PPO | Attending: Internal Medicine

## 2019-05-19 ENCOUNTER — Encounter: Payer: Self-pay | Admitting: Nurse Practitioner

## 2019-05-19 VITALS — BP 116/83 | HR 61 | Temp 97.7°F | Resp 20 | Ht 73.0 in | Wt 333.0 lb

## 2019-05-19 DIAGNOSIS — E785 Hyperlipidemia, unspecified: Secondary | ICD-10-CM | POA: Diagnosis not present

## 2019-05-19 DIAGNOSIS — I10 Essential (primary) hypertension: Secondary | ICD-10-CM

## 2019-05-19 DIAGNOSIS — N1831 Chronic kidney disease, stage 3a: Secondary | ICD-10-CM

## 2019-05-19 DIAGNOSIS — Z23 Encounter for immunization: Secondary | ICD-10-CM

## 2019-05-19 DIAGNOSIS — I129 Hypertensive chronic kidney disease with stage 1 through stage 4 chronic kidney disease, or unspecified chronic kidney disease: Secondary | ICD-10-CM

## 2019-05-19 DIAGNOSIS — E8881 Metabolic syndrome: Secondary | ICD-10-CM | POA: Insufficient documentation

## 2019-05-19 DIAGNOSIS — R001 Bradycardia, unspecified: Secondary | ICD-10-CM

## 2019-05-19 DIAGNOSIS — R7303 Prediabetes: Secondary | ICD-10-CM

## 2019-05-19 MED ORDER — VALSARTAN-HYDROCHLOROTHIAZIDE 320-25 MG PO TABS: 1.0000 | ORAL_TABLET | Freq: Every day | ORAL | 1 refills | Status: DC

## 2019-05-19 MED ORDER — AMLODIPINE BESYLATE 10 MG PO TABS: 10.0000 mg | ORAL_TABLET | Freq: Every day | ORAL | 1 refills | Status: DC

## 2019-05-19 MED ORDER — SPIRONOLACTONE 25 MG PO TABS: 25.0000 mg | ORAL_TABLET | Freq: Every day | ORAL | 1 refills | Status: DC

## 2019-05-19 MED ORDER — SIMVASTATIN 20 MG PO TABS: 20.0000 mg | ORAL_TABLET | Freq: Every day | ORAL | 0 refills | Status: DC

## 2019-05-19 MED ORDER — FENOFIBRATE 145 MG PO TABS: 145.0000 mg | ORAL_TABLET | Freq: Every day | ORAL | 1 refills | Status: DC

## 2019-05-19 NOTE — Progress Notes (Signed)
Subjective:    Patient ID: Ian Stewart, male    DOB: [birth year], 49 y.o.   MRN: [id number]   Chief Complaint: Medical Management of Chronic Issues    HPI:  1. Essential hypertension No c/o chest pain, sob or headache. Does not check blood pressure at home. BP Readings from Last 3 Encounters:  04/29/19 135/83  03/16/19 (!) 141/90  11/05/18 130/88     2. Hyperlipidemia with target LDL less than 100 Does not watch diet and does no exercsie. Lab Results  Component Value Date   CHOL 181 11/05/2018   HDL 30 (L) 11/05/2018   LDLCALC 83 11/05/2018   TRIG 340 (H) 11/05/2018   CHOLHDL 6.0 (H) 11/05/2018     3. Bradycardia No syncopial or dizzy episodes  4. Chronic kidney disease (CKD) stage G3a/A1, moderately decreased glomerular filtration rate (GFR) between 45-59 mL/min/1.73 square meter and albuminuria creatinine ratio less than 30 mg/g deneis any voiding issues. Lab Results  Component Value Date   CREATININE 1.40 (H) 11/05/2018   * creatine 1.86 (05/09/19- work labs)  5. Morbid obesity ([place]) No recent weight changes Wt Readings from Last 3 Encounters:  04/29/19 (!) 331 lb (150.1 kg)  04/15/19 (!) 331 lb (150.1 kg)  03/16/19 (!) 335 lb 12.8 oz (152.3 kg)   BMI Readings from Last 3 Encounters:  04/29/19 43.67 kg/m  04/15/19 43.67 kg/m  03/16/19 44.30 kg/m    * labs done at work- glucose 130 and hgba1c 6.2 ( 05/09/19 )    Outpatient Encounter Medications as of 05/19/2019  Medication Sig  . acetaminophen (TYLENOL) 500 MG tablet Take 1,000 mg by mouth every 6 (six) hours as needed for mild pain or moderate pain.  Marland Kitchen amLODipine (NORVASC) 10 MG tablet Take 1 tablet (10 mg total) by mouth daily.  Marland Kitchen aspirin 81 MG tablet Take 81 mg by mouth daily.  Marland Kitchen BYSTOLIC 10 MG tablet TAKE 1 TABLET BY MOUTH  DAILY  . cholecalciferol (VITAMIN D) 1000 UNITS tablet Take 1,000 Units by mouth every morning.   . fenofibrate (TRICOR) 145 MG tablet TAKE 1 TABLET BY MOUTH  DAILY  .  fluticasone (FLONASE) 50 MCG/ACT nasal spray Place 2 sprays into both nostrils every morning.   . Multiple Vitamin (MULTIVITAMIN) capsule Take 1 capsule by mouth every morning.   . simvastatin (ZOCOR) 20 MG tablet Take 1 tablet (20 mg total) by mouth daily.  Marland Kitchen spironolactone (ALDACTONE) 25 MG tablet Take 1 tablet (25 mg total) by mouth daily.  . valsartan-hydrochlorothiazide (DIOVAN-HCT) 320-25 MG tablet Take 1 tablet by mouth daily.     Past Surgical History:  Procedure Laterality Date  . RETINAL DETACHMENT SURGERY      Family History  Problem Relation Age of Onset  . CAD Mother 61  . CAD Father 95       CABG, died age 67  . Colon cancer Neg Hx   . Esophageal cancer Neg Hx   . Stomach cancer Neg Hx   . Rectal cancer Neg Hx     New complaints: None today  Social history: Lives by hisself  Controlled substance contract: n/a    Review of Systems  Constitutional: Negative for diaphoresis.  Eyes: Negative for pain.  Respiratory: Negative for shortness of breath.   Cardiovascular: Negative for chest pain, palpitations and leg swelling.  Gastrointestinal: Negative for abdominal pain.  Endocrine: Negative for polydipsia.  Skin: Negative for rash.  Neurological: Negative for dizziness, weakness and headaches.  Hematological: Does not bruise/bleed  easily.  All other systems reviewed and are negative.      Objective:   Physical Exam Vitals and nursing note reviewed.  Constitutional:      Appearance: Normal appearance. He is well-developed.  HENT:     Head: Normocephalic.     Nose: Nose normal.  Eyes:     Pupils: Pupils are equal, round, and reactive to light.  Neck:     Thyroid: No thyroid mass or thyromegaly.     Vascular: No carotid bruit or JVD.     Trachea: Phonation normal.  Cardiovascular:     Rate and Rhythm: Normal rate and regular rhythm.  Pulmonary:     Effort: Pulmonary effort is normal. No respiratory distress.     Breath sounds: Normal breath  sounds.  Abdominal:     General: Bowel sounds are normal.     Palpations: Abdomen is soft.     Tenderness: There is no abdominal tenderness.  Musculoskeletal:        General: Normal range of motion.     Cervical back: Normal range of motion and neck supple.  Lymphadenopathy:     Cervical: No cervical adenopathy.  Skin:    General: Skin is warm and dry.  Neurological:     Mental Status: He is alert and oriented to person, place, and time.  Psychiatric:        Behavior: Behavior normal.        Thought Content: Thought content normal.        Judgment: Judgment normal.     BP 116/83   Pulse 61   Temp 97.7 F (36.5 C) (Temporal)   Resp 20   Ht 6\' 1"  (1.854 m)   Wt (!) 333 lb (151 kg)   SpO2 99%   BMI 43.93 kg/m         Assessment & Plan:  Ian Stewart comes in today with chief complaint of Medical Management of Chronic Issues   Diagnosis and orders addressed:  1. Essential hypertension Low sodium diet - amLODipine (NORVASC) 10 MG tablet; Take 1 tablet (10 mg total) by mouth daily.  Dispense: 90 tablet; Refill: 1 - valsartan-hydrochlorothiazide (DIOVAN-HCT) 320-25 MG tablet; Take 1 tablet by mouth daily.  Dispense: 90 tablet; Refill: 1 - spironolactone (ALDACTONE) 25 MG tablet; Take 1 tablet (25 mg total) by mouth daily.  Dispense: 90 tablet; Refill: 1  2. Hyperlipidemia with target LDL less than 100 Low fat diet - simvastatin (ZOCOR) 20 MG tablet; Take 1 tablet (20 mg total) by mouth daily.  Dispense: 90 tablet; Refill: 0 - fenofibrate (TRICOR) 145 MG tablet; Take 1 tablet (145 mg total) by mouth daily.  Dispense: 90 tablet; Refill: 1  3. Bradycardia  4. Chronic kidney disease (CKD) stage G3a/A1, moderately decreased glomerular filtration rate (GFR) between 45-59 mL/min/1.73 square meter and albuminuria creatinine ratio less than 30 mg/g Will recehk kidney function in 3 months  5. Morbid obesity ([place]) Discussed diet and exercise for person with BMI >25 Will  recheck weight in 3-6 months  6. Prediabetes Strict low carb diet   Labs pending Health Maintenance reviewed Diet and exercise encouraged  Follow up plan: 3 month   [place], FNP

## 2019-05-19 NOTE — Patient Instructions (Signed)
Carbohydrate Counting for Diabetes Mellitus, Adult  Carbohydrate counting is a method of keeping track of how many carbohydrates you eat. Eating carbohydrates naturally increases the amount of sugar (glucose) in the blood. Counting how many carbohydrates you eat helps keep your blood glucose within normal limits, which helps you manage your diabetes (diabetes mellitus). It is important to know how many carbohydrates you can safely have in each meal. This is different for every person. A diet and nutrition specialist (registered dietitian) can help you make a meal plan and calculate how many carbohydrates you should have at each meal and snack. Carbohydrates are found in the following foods:  Grains, such as breads and cereals.  Dried beans and soy products.  Starchy vegetables, such as potatoes, peas, and corn.  Fruit and fruit juices.  Milk and yogurt.  Sweets and snack foods, such as cake, cookies, candy, chips, and soft drinks. How do I count carbohydrates? There are two ways to count carbohydrates in food. You can use either of the methods or a combination of both. Reading "Nutrition Facts" on packaged food The "Nutrition Facts" list is included on the labels of almost all packaged foods and beverages in the [place] It includes:  The serving size.  Information about nutrients in each serving, including the grams (g) of carbohydrate per serving. To use the "Nutrition Facts":  Decide how many servings you will have.  Multiply the number of servings by the number of carbohydrates per serving.  The resulting number is the total amount of carbohydrates that you will be having. Learning standard serving sizes of other foods When you eat carbohydrate foods that are not packaged or do not include "Nutrition Facts" on the label, you need to measure the servings in order to count the amount of carbohydrates:  Measure the foods that you will eat with a food scale or measuring cup, if  needed.  Decide how many standard-size servings you will eat.  Multiply the number of servings by 15. Most carbohydrate-rich foods have about 15 g of carbohydrates per serving. ? For example, if you eat 8 oz (170 g) of strawberries, you will have eaten 2 servings and 30 g of carbohydrates (2 servings x 15 g = 30 g).  For foods that have more than one food mixed, such as soups and casseroles, you must count the carbohydrates in each food that is included. The following list contains standard serving sizes of common carbohydrate-rich foods. Each of these servings has about 15 g of carbohydrates:   hamburger bun or  English muffin.   oz (15 mL) syrup.   oz (14 g) jelly.  1 slice of bread.  1 six-inch tortilla.  3 oz (85 g) cooked rice or pasta.  4 oz (113 g) cooked dried beans.  4 oz (113 g) starchy vegetable, such as peas, corn, or potatoes.  4 oz (113 g) hot cereal.  4 oz (113 g) mashed potatoes or  of a large baked potato.  4 oz (113 g) canned or frozen fruit.  4 oz (120 mL) fruit juice.  4-6 crackers.  6 chicken nuggets.  6 oz (170 g) unsweetened dry cereal.  6 oz (170 g) plain fat-free yogurt or yogurt sweetened with artificial sweeteners.  8 oz (240 mL) milk.  8 oz (170 g) fresh fruit or one small piece of fruit.  24 oz (680 g) popped popcorn. Example of carbohydrate counting Sample meal  3 oz (85 g) chicken breast.  6 oz (170 g)   brown rice.  4 oz (113 g) corn.  8 oz (240 mL) milk.  8 oz (170 g) strawberries with sugar-free whipped topping. Carbohydrate calculation 1. Identify the foods that contain carbohydrates: ? Rice. ? Corn. ? Milk. ? Strawberries. 2. Calculate how many servings you have of each food: ? 2 servings rice. ? 1 serving corn. ? 1 serving milk. ? 1 serving strawberries. 3. Multiply each number of servings by 15 g: ? 2 servings rice x 15 g = 30 g. ? 1 serving corn x 15 g = 15 g. ? 1 serving milk x 15 g = 15 g. ? 1  serving strawberries x 15 g = 15 g. 4. Add together all of the amounts to find the total grams of carbohydrates eaten: ? 30 g + 15 g + 15 g + 15 g = 75 g of carbohydrates total. Summary  Carbohydrate counting is a method of keeping track of how many carbohydrates you eat.  Eating carbohydrates naturally increases the amount of sugar (glucose) in the blood.  Counting how many carbohydrates you eat helps keep your blood glucose within normal limits, which helps you manage your diabetes.  A diet and nutrition specialist (registered dietitian) can help you make a meal plan and calculate how many carbohydrates you should have at each meal and snack. This information is not intended to replace advice given to you by your health care provider. Make sure you discuss any questions you have with your health care provider. Document Revised: 09/18/2016 Document Reviewed: 08/08/2015 Elsevier Patient Education  2020 Elsevier Inc.  

## 2019-05-19 NOTE — Progress Notes (Signed)
   Covid-19 Vaccination Clinic  Name:  Elizah Coury    MRN: [id number] DOB: [birth year]  05/19/2019  Mr. Huyett was observed post Covid-19 immunization for 15 minutes without incident. He was provided with Vaccine Information Sheet and instruction to access the V-Safe system.   Mr. Motola was instructed to call 911 with any severe reactions post vaccine: Marland Kitchen Difficulty breathing  . Swelling of face and throat  . A fast heartbeat  . A bad rash all over body  . Dizziness and weakness   Immunizations Administered    Name Date Dose VIS Date Route   Moderna COVID-19 Vaccine 05/19/2019 11:00 AM 0.5 mL 02/08/2019 Intramuscular   Manufacturer: Moderna   Lot: [id number]   [id number]

## 2019-06-05 IMAGING — DX DG ABDOMEN 1V
3 series · 3 of 3 positions shown · non-contrast
Comparison: None.

CLINICAL DATA: 46-year-old male with right flank pain and
hematuria.

EXAM:
ABDOMEN - 1 VIEW

[abdomen kub (1 of 3)]
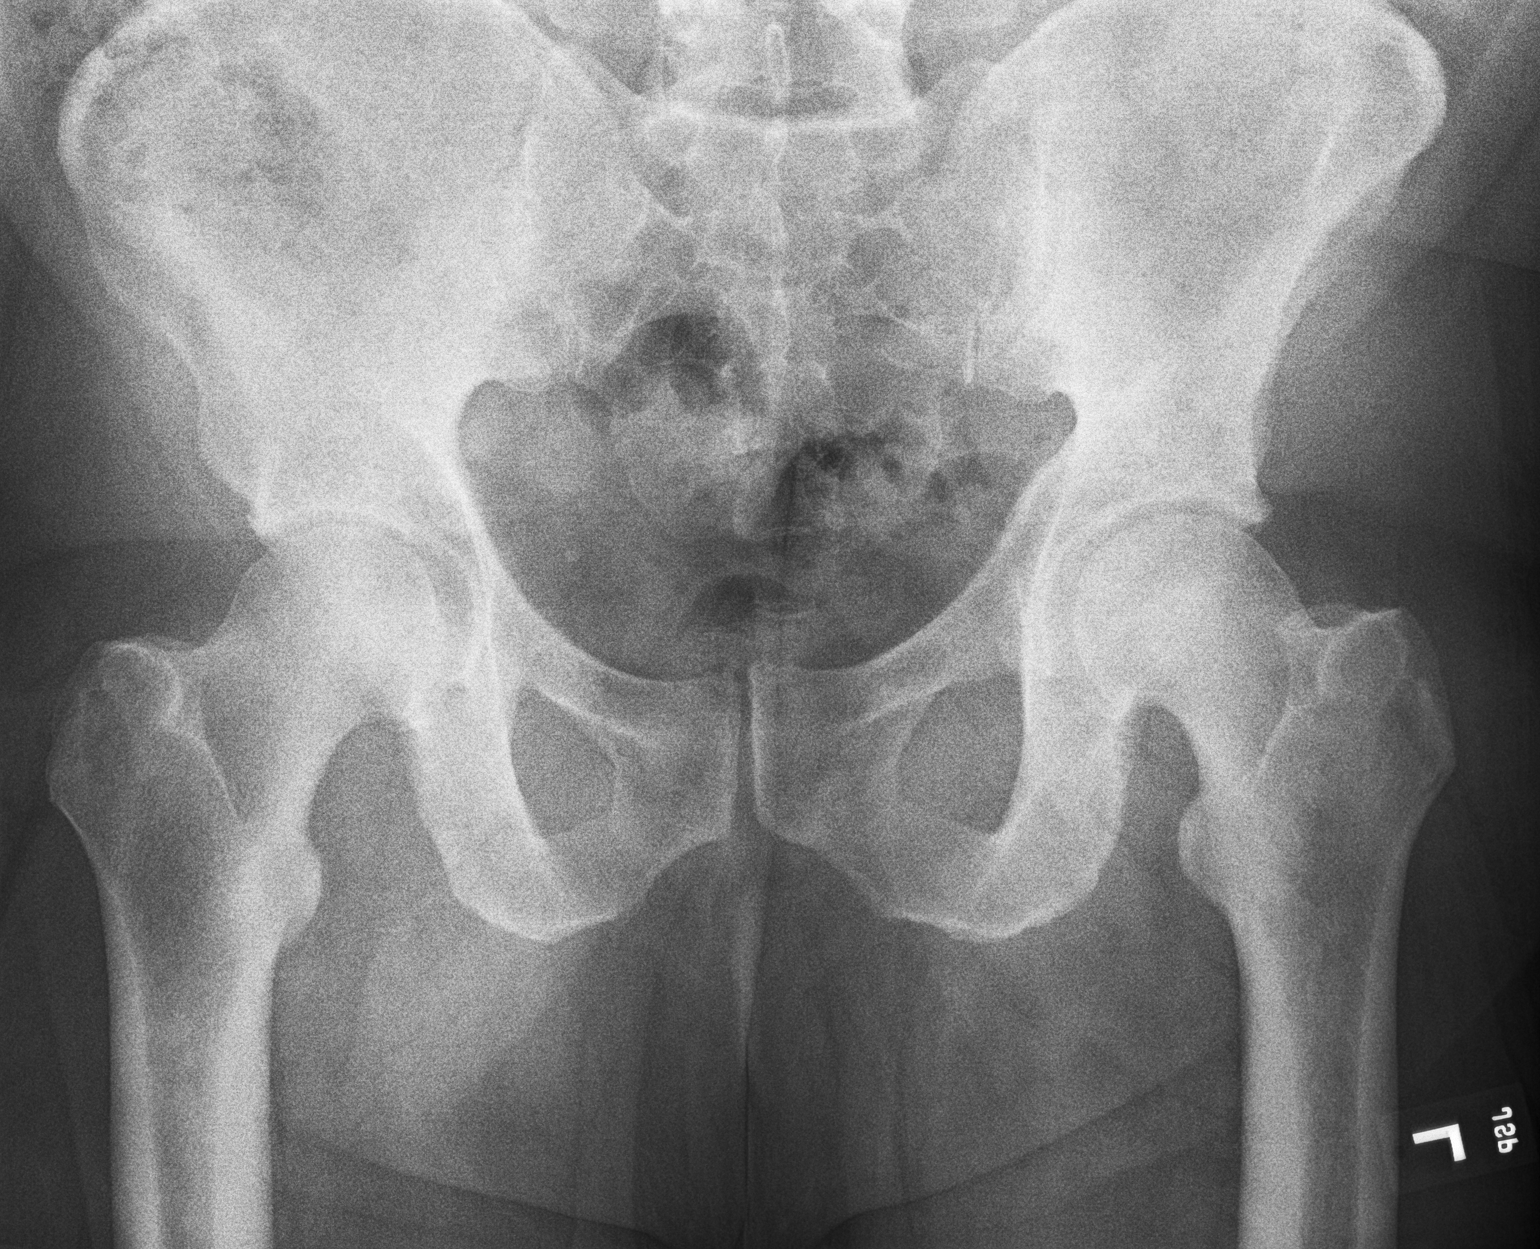

[abdomen kub (2 of 3)]
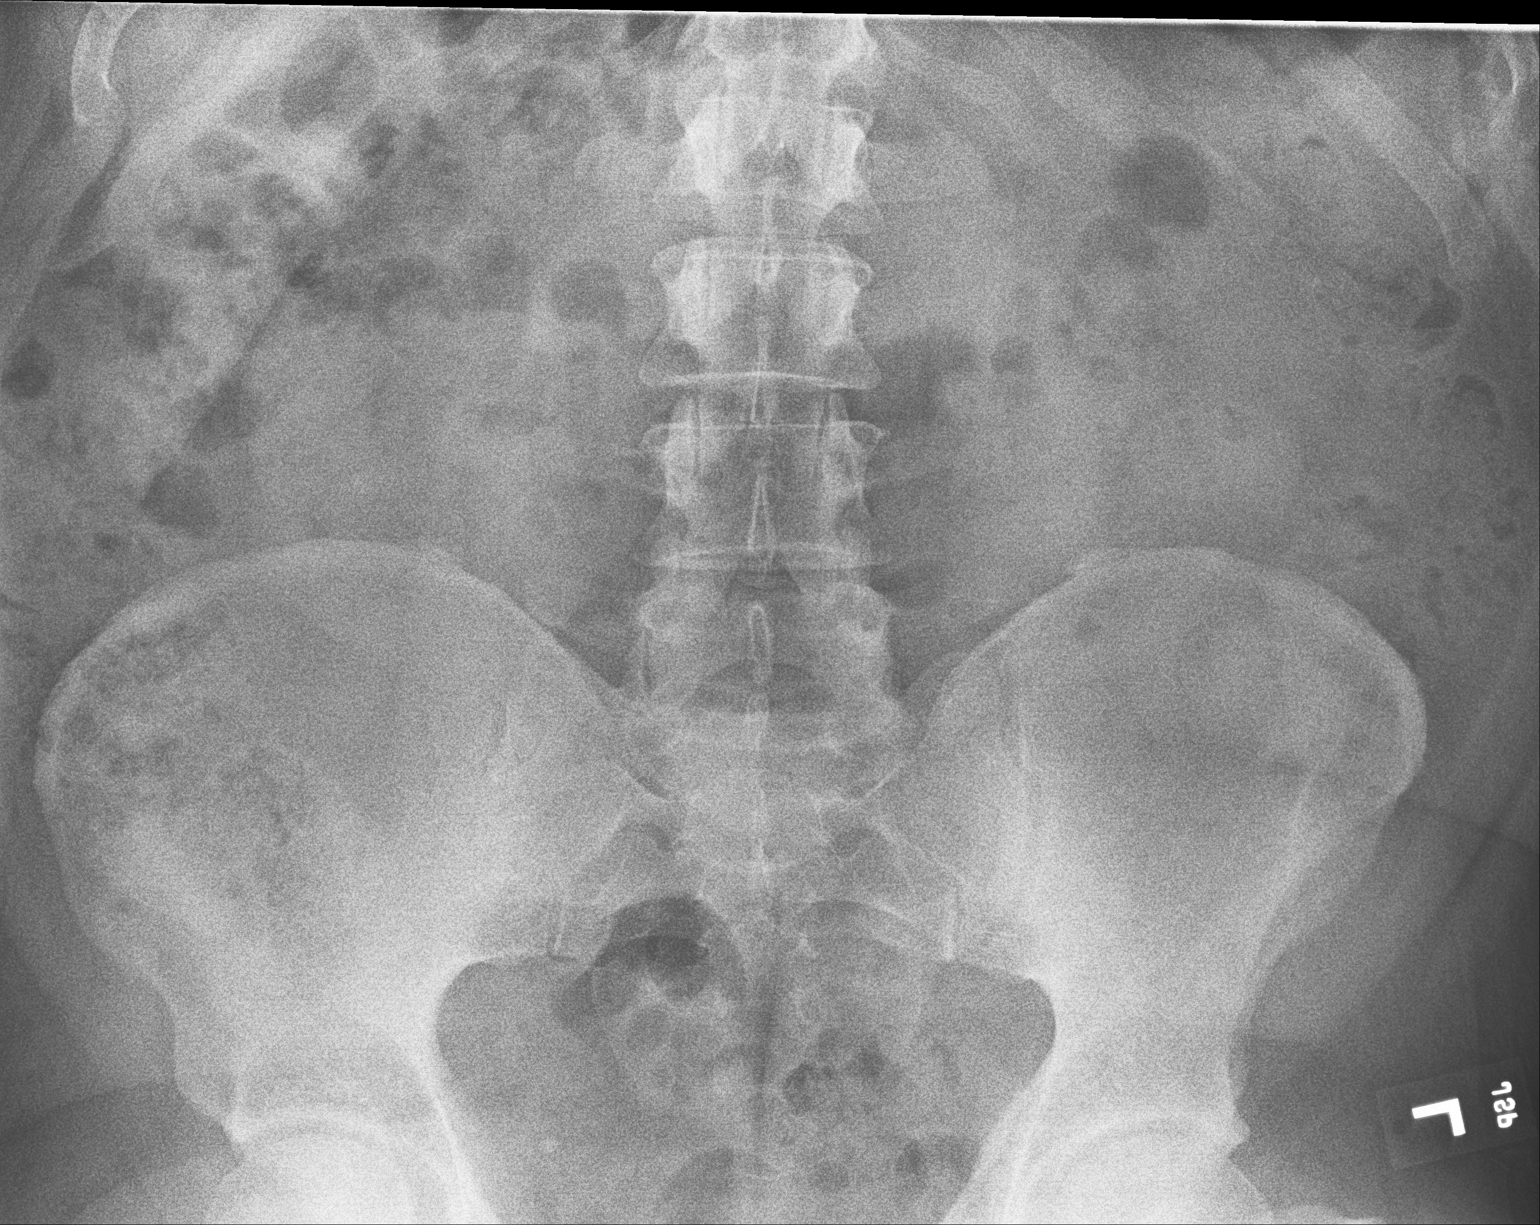

[abdomen kub (3 of 3)]
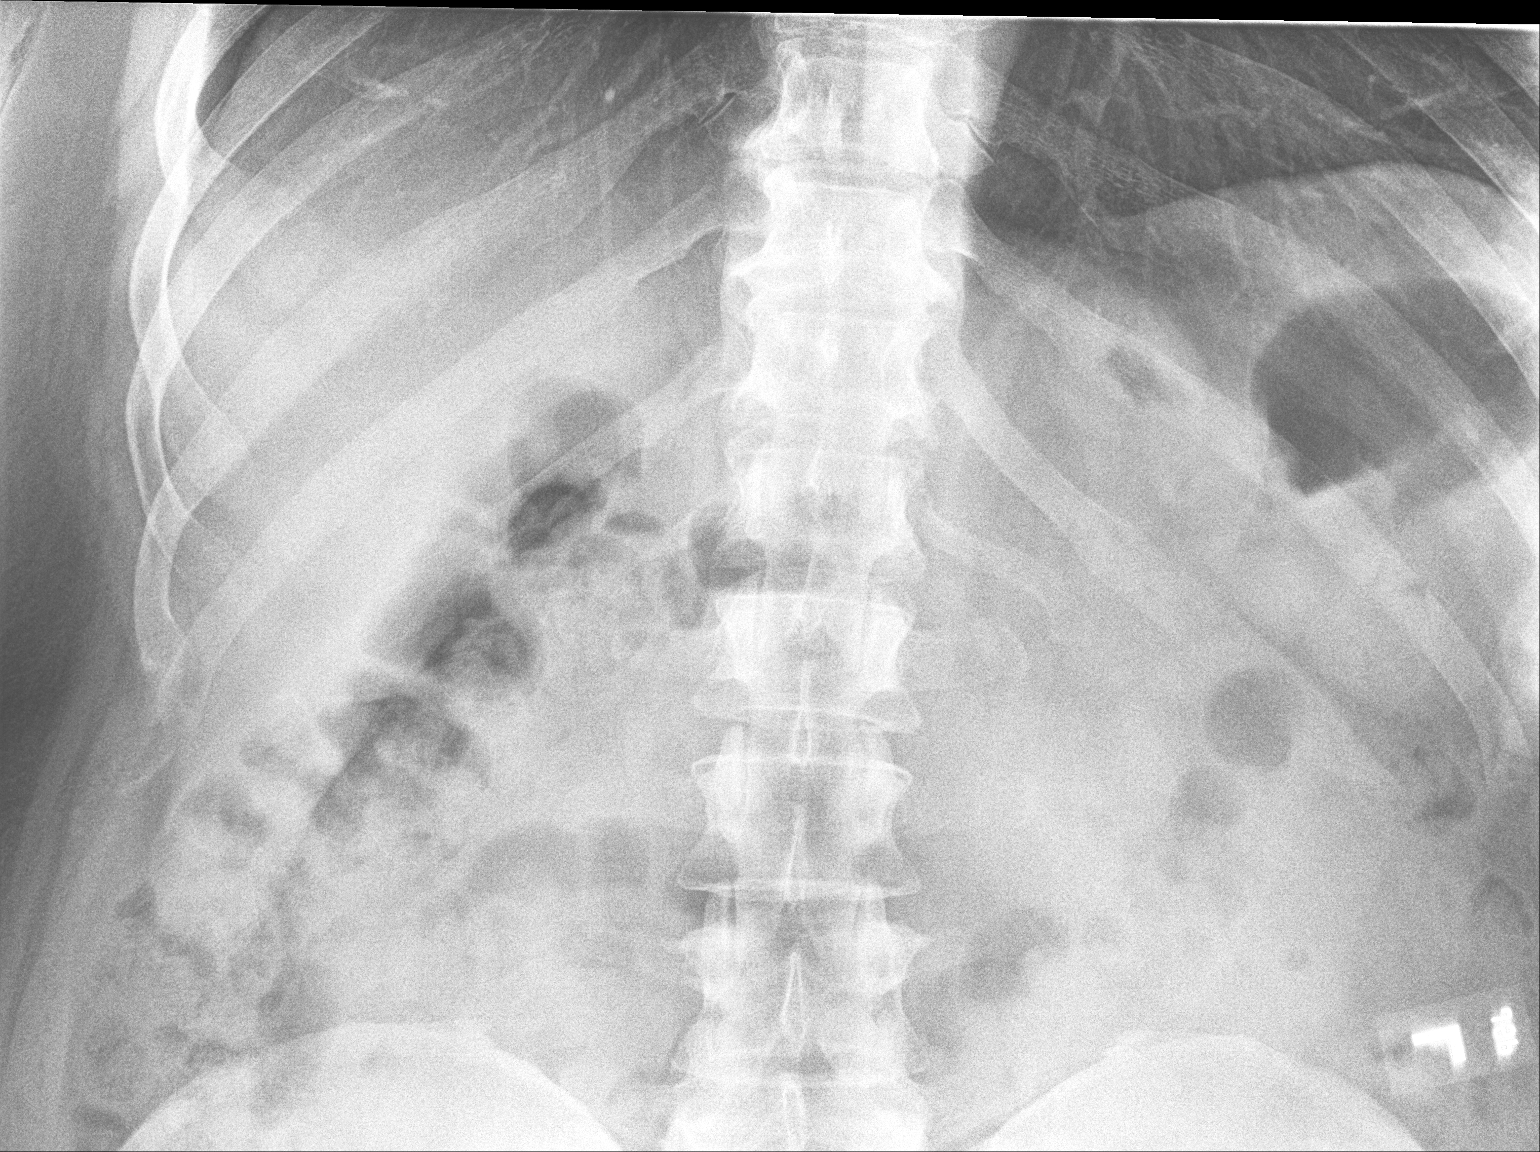

[3 of 3 positions shown; findings below may reference images not displayed]

FINDINGS: Suboptimal radiographic technique of the abdomen and pelvis, quantum
mottle artifact and under penetration. The lung bases appear
negative. Non obstructed bowel gas pattern. No acute osseous
abnormality identified. A small round calcific density in the right
hemipelvis is compatible with phlebolith. No definite urologic
calculus identified.
IMPRESSION: Suboptimal radiographic technique with no urologic calculus
identified. Noncontrast CT Abdomen and Pelvis would be most
sensitive.

## 2019-06-21 ENCOUNTER — Ambulatory Visit: Payer: Self-pay

## 2019-06-25 DIAGNOSIS — Z7189 Other specified counseling: Secondary | ICD-10-CM | POA: Insufficient documentation

## 2019-06-25 NOTE — Progress Notes (Signed)
Cardiology Office Note   Date:  06/27/2019   ID:  Ian Stewart, DOB [birth year], MRN [id number]  PCP:  Ian Pretty, FNP  Cardiologist:   No primary care provider on file.   Chief Complaint  Patient presents with  . Cardiomyopathy      History of Present Illness: Ian Stewart is a 49 y.o. male who was referred by Ian Pretty, FNP for evaluation of  bradycardia.  This was noted on his Apple Watch.  He was seen in the ED in October for this prior to our last visit.  He had some chest pain and had a negative POET (Plain Old Exercise Treadmill).    He was seen in January.  He was doing relatively well.  Because of multiple risk factors he did have an echocardiogram which demonstrated his ejection fraction to be slightly reduced at 45%.  He came back today to discuss this.  He has been under quite a bit of stress.  His mom has a terminal cancer.  He was caring for her at home.  She is currently in the hospital and probably going to a specialty nursing home because she has a tracheostomy.  He is also been caring for his brother who fell and broke his ankle.  He has been having to live in their home and has not been to his own home in quite a while.  He is exhausted.  He is not sleeping well at night.  He is not feeling any palpitations, presyncope or syncope.  He has no new shortness of breath, PND or orthopnea.  He has no weight gain or edema.    Past Medical History:  Diagnosis Date  . Arrhythmia    BRADYCARDIA/ 41-50 BEATS  . Chronic kidney disease    monitoring kidey function due to other medications  . Hyperlipidemia   . Hypertension     Past Surgical History:  Procedure Laterality Date  . RETINAL DETACHMENT SURGERY       Current Outpatient Medications  Medication Sig Dispense Refill  . acetaminophen (TYLENOL) 500 MG tablet Take 1,000 mg by mouth every 6 (six) hours as needed for mild pain or moderate pain.    Marland Kitchen amLODipine (NORVASC) 10 MG tablet Take 1  tablet (10 mg total) by mouth daily. 90 tablet 1  . aspirin 81 MG tablet Take 81 mg by mouth daily.    . fenofibrate (TRICOR) 145 MG tablet Take 1 tablet (145 mg total) by mouth daily. 90 tablet 1  . fluticasone (FLONASE) 50 MCG/ACT nasal spray Place 2 sprays into both nostrils every morning.     . Multiple Vitamin (MULTIVITAMIN) capsule Take 1 capsule by mouth every morning.     . simvastatin (ZOCOR) 20 MG tablet Take 1 tablet (20 mg total) by mouth daily. 90 tablet 0  . spironolactone (ALDACTONE) 25 MG tablet Take 1 tablet (25 mg total) by mouth daily. 90 tablet 1  . valsartan-hydrochlorothiazide (DIOVAN-HCT) 320-25 MG tablet Take 1 tablet by mouth daily. 90 tablet 1  . metoprolol succinate (TOPROL-XL) 100 MG 24 hr tablet Take 1 tablet (100 mg total) by mouth daily. Take with or immediately following a meal. 90 tablet 3   No current facility-administered medications for this visit.    Allergies:   Lipitor [atorvastatin]    ROS:  Please see the history of present illness.   Otherwise, review of systems are positive for none.   All other systems are reviewed and negative.    PHYSICAL  EXAM: VS:  BP 116/78 (BP Location: Left Arm, Patient Position: Sitting, Cuff Size: Large)   Pulse (!) 56   Temp (!) 97.3 F (36.3 C)   Ht 6\' 1"  (1.854 m)   Wt (!) 311 lb (141.1 kg)   BMI 41.03 kg/m  , BMI Body mass index is 41.03 kg/m. GENERAL:  Well appearing NECK:  No jugular venous distention, waveform within normal limits, carotid upstroke brisk and symmetric, no bruits, no thyromegaly LUNGS:  Clear to auscultation bilaterally CHEST:  Unremarkable HEART:  PMI not displaced or sustained,S1 and S2 within normal limits, no S3, no S4, no clicks, no rubs, no murmurs ABD:  Flat, positive bowel sounds normal in frequency in pitch, no bruits, no rebound, no guarding, no midline pulsatile mass, no hepatomegaly, no splenomegaly EXT:  2 plus pulses throughout, no edema, no cyanosis no clubbing    EKG:   EKG is not ordered today. NA   Recent Labs: 11/05/2018: ALT 23; BUN 27; Creatinine, Ser 1.40; Potassium 4.5; Sodium 141    Lipid Panel    Component Value Date/Time   CHOL 181 11/05/2018 0908   TRIG 340 (H) 11/05/2018 0908   TRIG 263 (H) 07/20/2014 1450   HDL 30 (L) 11/05/2018 0908   HDL 37 (L) 07/20/2014 1450   CHOLHDL 6.0 (H) 11/05/2018 0908   LDLCALC 83 11/05/2018 0908      Wt Readings from Last 3 Encounters:  06/27/19 (!) 311 lb (141.1 kg)  05/19/19 (!) 333 lb (151 kg)  04/29/19 (!) 331 lb (150.1 kg)      Other studies Reviewed: Additional studies/ records that were reviewed today include: echo, labs. Review of the above records demonstrates:  Please see elsewhere in the note.     ASSESSMENT AND PLAN:  HTN:   This will be managed in the context of treating his slightly reduced ejection fraction.  CARDIOMYOPATHY: He has had a negative previous stress test.  He has no abnormalities on his EKG to suggest previous MI.  He has a mild global hypokinesis.  May reassess this again in the future with another ischemia evaluation but currently I am going to just medically manage and then reassess the ejection fraction first.  I am going to stop his Bystolic and start Toprol-XL 100 mg daily.  We can see him back in a couple of months to make another adjustment.  He is on an adequate dose of ARB.  Blood pressure is otherwise well controlled.  He does have bradycardia but he wears a wearable and will let me know if he is having any symptomatic bradycardia arrhythmias with this switch.  If he does we have to switch him back to [employer].  I will check a TSH.  BRADYCARDIA: As above.  DYSLIPIDEMIA: Lipids were okay.  No change in therapy.  OBESITY: In front of him for 23 pounds of weight loss.  He is encouraged to continue more.  COVID EDUCATION: He has been vaccinated.  Current medicines are reviewed at length with the patient today.  The patient does not have concerns regarding  medicines.  The following changes have been made:  no change  Labs/ tests ordered today include: None  Orders Placed This Encounter  Procedures  . TSH     Disposition:   FU with Jory Sims DNP in 2 months.     Signed, Minus Breeding, MD  06/27/2019 10:22 AM    [facility]

## 2019-06-27 ENCOUNTER — Other Ambulatory Visit: Payer: Self-pay

## 2019-06-27 ENCOUNTER — Encounter: Payer: Self-pay | Admitting: Cardiology

## 2019-06-27 ENCOUNTER — Ambulatory Visit: Payer: BC Managed Care – PPO | Admitting: Cardiology

## 2019-06-27 VITALS — BP 116/78 | HR 56 | Temp 97.3°F | Ht 73.0 in | Wt 311.0 lb

## 2019-06-27 DIAGNOSIS — R002 Palpitations: Secondary | ICD-10-CM

## 2019-06-27 DIAGNOSIS — Z7189 Other specified counseling: Secondary | ICD-10-CM | POA: Diagnosis not present

## 2019-06-27 DIAGNOSIS — R001 Bradycardia, unspecified: Secondary | ICD-10-CM | POA: Diagnosis not present

## 2019-06-27 DIAGNOSIS — E785 Hyperlipidemia, unspecified: Secondary | ICD-10-CM

## 2019-06-27 LAB — TSH: TSH: 1.36 u[IU]/mL (ref 0.450–4.500)

## 2019-06-27 MED ORDER — METOPROLOL SUCCINATE ER 100 MG PO TB24: 100.0000 mg | ORAL_TABLET | Freq: Every day | ORAL | 3 refills | Status: DC

## 2019-06-27 NOTE — Patient Instructions (Signed)
Medication Instructions:  STOP BYSTOLIC START TOPROL XL 100MG  DAILY *If you need a refill on your cardiac medications before your next appointment, please call your pharmacy*  Lab Work: Your physician recommends that you return for lab work TODAY (TSH)  If you have labs (blood work) drawn today and your tests are completely normal, you will receive your results only by: Marland Kitchen MyChart Message (if you have MyChart) OR . A paper copy in the mail If you have any lab test that is abnormal or we need to change your treatment, we will call you to review the results.  Testing/Procedures: NONE ORDERED THIS VISIT  Follow-Up: At [facility], you and your health needs are our priority.  As part of our continuing mission to provide you with exceptional heart care, we have created designated Provider Care Teams.  These Care Teams include your primary Cardiologist (physician) and Advanced Practice Providers (APPs -  Physician Assistants and Nurse Practitioners) who all work together to provide you with the care you need, when you need it.  Your next appointment:   2 month(s)  The format for your next appointment:   In Person  Provider:   Jory Sims, DNP, ANP

## 2019-08-25 NOTE — Progress Notes (Signed)
Cardiology Office Note   Date:  08/29/2019   ID:  Ian Stewart, DOB Nov 14, 1970, MRN [id number]  PCP:  Chevis Pretty, FNP  Cardiologist:  Dr. Percival Spanish  CC: Follow Up Bradycardia    History of Present Illness: Ian Stewart is a 49 y.o. male who presents for ongoing assessment and management of bradycardia, initially seen by Dr. Percival Spanish in January 2021.  The patient complained of some chest pain, and had a negative POET.   Because of multiple risk factors the patient had an echocardiogram which demonstrated EF 45%.  On last office visit dated 06/27/2019 he admitted to being under a lot of stress, his mother had been diagnosed with cancer and is now terminal.  He was caring for her at home until she transition to skilled nursing facility.  The patient was also taking care of his brother who had broken his ankle.  When seen by Dr. Percival Spanish on that office visit the patient was going to be treated with watchful waiting.  His Bystolic was discontinued and Toprol XL 100 mg daily was started.  Blood pressure was well controlled.  He was continued on ARB.  At the time the patient was asymptomatic.  He was to call for any episodes of bradycardia which were symptomatic.    He does have an apple watch which allows him to evaluate his heart rate, and for bradycardia arrhythmias.  If he does have arrhythmias Dr. Percival Spanish would like him to be restarted back on Bystolic instead.  A TSH was checked.  This was completed on 06/27/2019 with a TSH level of 1.360.  He denies chest pain, presyncope, syncope, or dyspnea on exertion.  He has checked his heart rate on several occasions, lowest heart rate was 39 bpm on June 18 at 3 AM, next lowest heart rate was 39 bpm on June 19 at 3 AM.  He states that his alarm on his phone documented the lower heart rate.  He is not very active.  Past Medical History:  Diagnosis Date  . Arrhythmia    BRADYCARDIA/ 41-50 BEATS  . Chronic kidney disease    monitoring kidey  function due to other medications  . Hyperlipidemia   . Hypertension     Past Surgical History:  Procedure Laterality Date  . RETINAL DETACHMENT SURGERY       Current Outpatient Medications  Medication Sig Dispense Refill  . acetaminophen (TYLENOL) 500 MG tablet Take 1,000 mg by mouth every 6 (six) hours as needed for mild pain or moderate pain.    Marland Kitchen amLODipine (NORVASC) 10 MG tablet Take 1 tablet (10 mg total) by mouth daily. 90 tablet 1  . aspirin 81 MG tablet Take 81 mg by mouth daily.    . fenofibrate (TRICOR) 145 MG tablet Take 1 tablet (145 mg total) by mouth daily. 90 tablet 1  . fluticasone (FLONASE) 50 MCG/ACT nasal spray Place 2 sprays into both nostrils every morning.     . metoprolol succinate (TOPROL-XL) 100 MG 24 hr tablet Take 1 tablet (100 mg total) by mouth daily. Take with or immediately following a meal. 90 tablet 3  . Multiple Vitamin (MULTIVITAMIN) capsule Take 1 capsule by mouth every morning.     . simvastatin (ZOCOR) 20 MG tablet Take 1 tablet (20 mg total) by mouth daily. 90 tablet 3  . spironolactone (ALDACTONE) 25 MG tablet Take 1 tablet (25 mg total) by mouth daily. 90 tablet 3  . valsartan-hydrochlorothiazide (DIOVAN-HCT) 320-25 MG tablet Take 1 tablet by mouth  daily. 90 tablet 1   No current facility-administered medications for this visit.    Allergies:   Lipitor [atorvastatin]    Social History:  The patient  reports that he has never smoked. He has never used smokeless tobacco. He reports that he does not drink alcohol and does not use drugs.   Family History:  The patient's family history includes CAD (age of onset: 40) in his father; CAD (age of onset: 1) in his mother.    ROS: All other systems are reviewed and negative. Unless otherwise mentioned in H&P    PHYSICAL EXAM: VS:  BP (!) 142/102   Pulse (!) 56   Ht 6\' 1"  (1.854 m)   Wt (!) 307 lb (139.3 kg)   SpO2 98%   BMI 40.50 kg/m  , BMI Body mass index is 40.5 kg/m. GEN: Well  nourished, well developed, in no acute distress, obese HEENT: normal Neck: no JVD, carotid bruits, or masses Cardiac: RRR, bradycardic; no murmurs, rubs, or gallops,no edema  Respiratory:  Clear to auscultation bilaterally, normal work of breathing GI: soft, nontender, nondistended, + BS MS: no deformity or atrophy Skin: warm and dry, no rash Neuro:  Strength and sensation are intact Psych: euthymic mood, flat affect.   EKG: Sinus bradycardia, heart rate of 55 bpm, inferior lateral T wave abnormality, (personally reviewed).  Recent Labs: 11/05/2018: ALT 23; BUN 27; Creatinine, Ser 1.40; Potassium 4.5; Sodium 141 06/27/2019: TSH 1.360    Lipid Panel    Component Value Date/Time   CHOL 181 11/05/2018 0908   TRIG 340 (H) 11/05/2018 0908   TRIG 263 (H) 07/20/2014 1450   HDL 30 (L) 11/05/2018 0908   HDL 37 (L) 07/20/2014 1450   CHOLHDL 6.0 (H) 11/05/2018 0908   LDLCALC 83 11/05/2018 0908      Wt Readings from Last 3 Encounters:  08/29/19 (!) 307 lb (139.3 kg)  06/27/19 (!) 311 lb (141.1 kg)  05/19/19 (!) 333 lb (151 kg)      Other studies Reviewed: Echocardiogram 03-31-19 1. Left ventricular ejection fraction, by visual estimation, is 45 to  50%. The left ventricle has mildly decreased function. There is no left  ventricular hypertrophy.  2. Elevated left ventricular end-diastolic pressure.  3. Left ventricular diastolic parameters are consistent with Grade I  diastolic dysfunction (impaired relaxation).  4. The left ventricle has no regional wall motion abnormalities.  5. Global right ventricle has normal systolic function.The right  ventricular size is moderately enlarged. No increase in right ventricular  wall thickness.  6. Left atrial size was moderately dilated.  7. Right atrial size was moderately dilated.  8. The mitral valve is normal in structure. No evidence of mitral valve  regurgitation.  9. The tricuspid valve is normal in structure.  10. The  aortic valve was not well visualized. Aortic valve regurgitation  is not visualized. No evidence of aortic valve sclerosis or stenosis.  11. The pulmonic valve was not well visualized. Pulmonic valve  regurgitation is not visualized.  12. TR signal is inadequate for assessing pulmonary artery systolic  pressure.  13. The inferior vena cava is normal in size with greater than 50%  respiratory variability, suggesting right atrial pressure of 3 mmHg.    ASSESSMENT AND PLAN:  1.  Sinus bradycardia: Asymptomatic.  His Apple Watch has recorded to lower heart rates at 3 AM in the morning on consecutive days at 39 bpm.  The patient was asleep and was unaware.  He is not very  active but when he is active he denies any chest pain or dyspnea on exertion.  He is denying any syncope or presyncope or dizziness.  He is currently on metoprolol succinate 100 mg daily.  If he does become symptomatic may need to consider decreasing the dose.  2.  Hypertension: Blood pressure is elevated today.  He is taking his medication amlodipine metoprolol spironolactone and valsartan HCTZ.  We will continue to follow this.  He is to keep up with his blood pressure at home if elevated he will need to report this to [place].  3.  Hyperlipidemia: Currently on simvastatin 20 mg daily.  He will need to have fasting lipids and LFTs on next office visit.  Most recent LDL 83;, total cholesterol 181; HDL 30; triglycerides 340; on 09/05/2018.    Current medicines are reviewed at length with the patient today.  I have spent 25 minutes dedicated to the care of this patient on the date of this encounter to include pre-visit review of records, assessment, management and diagnostic testing,with shared decision making.  Labs/ tests ordered today include: None  Phill Myron. West Pugh, ANP, [facility]   08/29/2019 11:24 AM    [facility] [address] Office [phone] Fax [phone]  Notice: This  dictation was prepared with Dragon dictation along with smaller phrase technology. Any transcriptional errors that result from this process are unintentional and may not be corrected upon review.

## 2019-08-29 ENCOUNTER — Other Ambulatory Visit: Payer: Self-pay

## 2019-08-29 ENCOUNTER — Encounter: Payer: Self-pay | Admitting: Adult Health

## 2019-08-29 ENCOUNTER — Ambulatory Visit: Payer: BC Managed Care – PPO | Admitting: Adult Health

## 2019-08-29 VITALS — BP 142/102 | HR 56 | Ht 73.0 in | Wt 307.0 lb

## 2019-08-29 DIAGNOSIS — Z79899 Other long term (current) drug therapy: Secondary | ICD-10-CM

## 2019-08-29 DIAGNOSIS — E663 Overweight: Secondary | ICD-10-CM

## 2019-08-29 DIAGNOSIS — I10 Essential (primary) hypertension: Secondary | ICD-10-CM

## 2019-08-29 DIAGNOSIS — E785 Hyperlipidemia, unspecified: Secondary | ICD-10-CM

## 2019-08-29 DIAGNOSIS — R001 Bradycardia, unspecified: Secondary | ICD-10-CM | POA: Diagnosis not present

## 2019-08-29 DIAGNOSIS — E782 Mixed hyperlipidemia: Secondary | ICD-10-CM

## 2019-08-29 MED ORDER — SIMVASTATIN 20 MG PO TABS: 20.0000 mg | ORAL_TABLET | Freq: Every day | ORAL | 3 refills | Status: DC

## 2019-08-29 MED ORDER — SPIRONOLACTONE 25 MG PO TABS: 25.0000 mg | ORAL_TABLET | Freq: Every day | ORAL | 3 refills | Status: DC

## 2019-08-29 NOTE — Patient Instructions (Addendum)
Medication Instructions:  Continue current medications  *If you need a refill on your cardiac medications before your next appointment, please call your pharmacy*   Lab Work: None Ordered   Testing/Procedures: None Ordered   Follow-Up: At [facility], you and your health needs are our priority.  As part of our continuing mission to provide you with exceptional heart care, we have created designated Provider Care Teams.  These Care Teams include your primary Cardiologist (physician) and Advanced Practice Providers (APPs -  Physician Assistants and Nurse Practitioners) who all work together to provide you with the care you need, when you need it.  We recommend signing up for the patient portal called "MyChart".  Sign up information is provided on this After Visit Summary.  MyChart is used to connect with patients for Virtual Visits (Telemedicine).  Patients are able to view lab/test results, encounter notes, upcoming appointments, etc.  Non-urgent messages can be sent to your provider as well.   To learn more about what you can do with MyChart, go to [email or web address].    Your next appointment:   6 month(s)  The format for your next appointment:   In Person  Provider:   You may see James Hochrein, MD or one of the following Advanced Practice Providers on your designated Care Team:    Rhonda Barrett, PA-C  Kathryn Lawrence, DNP, ANP  Cadence Furth, NP     

## 2019-09-01 ENCOUNTER — Encounter: Payer: Self-pay | Admitting: Nurse Practitioner

## 2019-09-01 ENCOUNTER — Ambulatory Visit: Payer: BC Managed Care – PPO | Admitting: Nurse Practitioner

## 2019-09-01 ENCOUNTER — Other Ambulatory Visit: Payer: Self-pay

## 2019-09-01 VITALS — BP 129/91 | HR 66 | Temp 98.3°F | Resp 20 | Ht 73.0 in | Wt 306.0 lb

## 2019-09-01 DIAGNOSIS — M109 Gout, unspecified: Secondary | ICD-10-CM

## 2019-09-01 DIAGNOSIS — E785 Hyperlipidemia, unspecified: Secondary | ICD-10-CM | POA: Diagnosis not present

## 2019-09-01 DIAGNOSIS — N1831 Chronic kidney disease, stage 3a: Secondary | ICD-10-CM

## 2019-09-01 DIAGNOSIS — I10 Essential (primary) hypertension: Secondary | ICD-10-CM | POA: Diagnosis not present

## 2019-09-01 DIAGNOSIS — E8881 Metabolic syndrome: Secondary | ICD-10-CM

## 2019-09-01 DIAGNOSIS — R001 Bradycardia, unspecified: Secondary | ICD-10-CM

## 2019-09-01 LAB — BAYER DCA HB A1C WAIVED: HB A1C (BAYER DCA - WAIVED): 5.5 % (ref <7.0)

## 2019-09-01 MED ORDER — SPIRONOLACTONE 25 MG PO TABS: 25.0000 mg | ORAL_TABLET | Freq: Every day | ORAL | 3 refills | Status: DC

## 2019-09-01 MED ORDER — COLCHICINE 0.6 MG PO TABS: ORAL_TABLET | ORAL | 1 refills | Status: DC

## 2019-09-01 MED ORDER — SIMVASTATIN 20 MG PO TABS: 20.0000 mg | ORAL_TABLET | Freq: Every day | ORAL | 1 refills | Status: DC

## 2019-09-01 MED ORDER — METOPROLOL SUCCINATE ER 100 MG PO TB24: 100.0000 mg | ORAL_TABLET | Freq: Every day | ORAL | 1 refills | Status: DC

## 2019-09-01 MED ORDER — VALSARTAN-HYDROCHLOROTHIAZIDE 320-25 MG PO TABS: 1.0000 | ORAL_TABLET | Freq: Every day | ORAL | 1 refills | Status: DC

## 2019-09-01 MED ORDER — FENOFIBRATE 145 MG PO TABS: 145.0000 mg | ORAL_TABLET | Freq: Every day | ORAL | 1 refills | Status: DC

## 2019-09-01 MED ORDER — AMLODIPINE BESYLATE 10 MG PO TABS: 10.0000 mg | ORAL_TABLET | Freq: Every day | ORAL | 1 refills | Status: DC

## 2019-09-01 NOTE — Patient Instructions (Signed)

## 2019-09-01 NOTE — Progress Notes (Signed)
Subjective:    Patient ID: Ian Stewart, male    DOB: 1970/03/25, 49 y.o.   MRN: [id number]   Chief Complaint: Medical Management of Chronic Issues    HPI:  1. Essential hypertension No c/o chest pain, sob or headache. Does not check blood pressure at home. BP Readings from Last 3 Encounters:  09/01/19 (!) 129/91  08/29/19 (!) 142/102  06/27/19 116/78     2. Hyperlipidemia with target LDL less than 100 tries to wtahc diet but does no dedicated exercise. Lab Results  Component Value Date   CHOL 181 11/05/2018   HDL 30 (L) 11/05/2018   LDLCALC 83 11/05/2018   TRIG 340 (H) 11/05/2018   CHOLHDL 6.0 (H) 11/05/2018    3. Metabolic syndrome He does not check blood sugars at home very often. When he does check them they are around 100.  Lab Results  Component Value Date   HGBA1C 5.7 10/05/2018    4. Acute gout of right foot, unspecified cause Had gout flare up this week. Has gotten better  5. Chronic kidney disease (CKD) stage G3a/A1, moderately decreased glomerular filtration rate (GFR) between 45-59 mL/min/1.73 square meter and albuminuria creatinine ratio less than 30 mg/g No problems voiding Lab Results  Component Value Date   CREATININE 1.40 (H) 11/05/2018   BUN 27 (H) 11/05/2018   NA 141 11/05/2018   K 4.5 11/05/2018   CL 103 11/05/2018   CO2 23 11/05/2018     6. Bradycardia No near syncopial episodes  8. Morbid obesity ([place]) Weight is down about 20 lbs  Wt Readings from Last 3 Encounters:  09/01/19 (!) 306 lb (138.8 kg)  08/29/19 (!) 307 lb (139.3 kg)  06/27/19 (!) 311 lb (141.1 kg)   BMI Readings from Last 3 Encounters:  09/01/19 40.37 kg/m  08/29/19 40.50 kg/m  06/27/19 41.03 kg/m       Outpatient Encounter Medications as of 09/01/2019  Medication Sig  . acetaminophen (TYLENOL) 500 MG tablet Take 1,000 mg by mouth every 6 (six) hours as needed for mild pain or moderate pain.  Marland Kitchen amLODipine (NORVASC) 10 MG tablet Take 1 tablet (10 mg total)  by mouth daily.  Marland Kitchen aspirin 81 MG tablet Take 81 mg by mouth daily.  . fenofibrate (TRICOR) 145 MG tablet Take 1 tablet (145 mg total) by mouth daily.  . fluticasone (FLONASE) 50 MCG/ACT nasal spray Place 2 sprays into both nostrils every morning.   . metoprolol succinate (TOPROL-XL) 100 MG 24 hr tablet Take 1 tablet (100 mg total) by mouth daily. Take with or immediately following a meal.  . Multiple Vitamin (MULTIVITAMIN) capsule Take 1 capsule by mouth every morning.   . simvastatin (ZOCOR) 20 MG tablet Take 1 tablet (20 mg total) by mouth daily.  Marland Kitchen spironolactone (ALDACTONE) 25 MG tablet Take 1 tablet (25 mg total) by mouth daily.  . valsartan-hydrochlorothiazide (DIOVAN-HCT) 320-25 MG tablet Take 1 tablet by mouth daily.     Past Surgical History:  Procedure Laterality Date  . RETINAL DETACHMENT SURGERY      Family History  Problem Relation Age of Onset  . CAD Mother 65  . CAD Father 31       CABG, died age 58  . Colon cancer Neg Hx   . Esophageal cancer Neg Hx   . Stomach cancer Neg Hx   . Rectal cancer Neg Hx     New complaints: none today  Social history: Is having to help take care of his mom  and his brother. Mom has laryngeal cancer  Controlled substance contract: n/a    Review of Systems  Constitutional: Negative for diaphoresis.  Eyes: Negative for pain.  Respiratory: Negative for shortness of breath.   Cardiovascular: Negative for chest pain, palpitations and leg swelling.  Gastrointestinal: Negative for abdominal pain.  Endocrine: Negative for polydipsia.  Skin: Negative for rash.  Neurological: Negative for dizziness, weakness and headaches.  Hematological: Does not bruise/bleed easily.  All other systems reviewed and are negative.      Objective:   Physical Exam Vitals and nursing note reviewed.  Constitutional:      Appearance: Normal appearance. He is well-developed.  HENT:     Head: Normocephalic.     Nose: Nose normal.  Eyes:     Pupils:  Pupils are equal, round, and reactive to light.  Neck:     Thyroid: No thyroid mass or thyromegaly.     Vascular: No carotid bruit or JVD.     Trachea: Phonation normal.  Cardiovascular:     Rate and Rhythm: Normal rate and regular rhythm.  Pulmonary:     Effort: Pulmonary effort is normal. No respiratory distress.     Breath sounds: Normal breath sounds.  Abdominal:     General: Bowel sounds are normal.     Palpations: Abdomen is soft.     Tenderness: There is no abdominal tenderness.  Musculoskeletal:        General: Normal range of motion.     Cervical back: Normal range of motion and neck supple.  Lymphadenopathy:     Cervical: No cervical adenopathy.  Skin:    General: Skin is warm and dry.  Neurological:     Mental Status: He is alert and oriented to person, place, and time.  Psychiatric:        Behavior: Behavior normal.        Thought Content: Thought content normal.        Judgment: Judgment normal.    BP (!) 129/91   Pulse 66   Temp 98.3 F (36.8 C) (Temporal)   Resp 20   Ht 6' 1"  (1.854 m)   Wt (!) 306 lb (138.8 kg)   SpO2 96%   BMI 40.37 kg/m         Assessment & Plan:  Ian Stewart comes in today with chief complaint of Medical Management of Chronic Issues   Diagnosis and orders addressed:  1. Essential hypertension Low sodium diet - CBC with Differential/Platelet - CMP14+EGFR - spironolactone (ALDACTONE) 25 MG tablet; Take 1 tablet (25 mg total) by mouth daily.  Dispense: 90 tablet; Refill: 3 - amLODipine (NORVASC) 10 MG tablet; Take 1 tablet (10 mg total) by mouth daily.  Dispense: 90 tablet; Refill: 1 - valsartan-hydrochlorothiazide (DIOVAN-HCT) 320-25 MG tablet; Take 1 tablet by mouth daily.  Dispense: 90 tablet; Refill: 1 - metoprolol succinate (TOPROL-XL) 100 MG 24 hr tablet; Take 1 tablet (100 mg total) by mouth daily. Take with or immediately following a meal.  Dispense: 90 tablet; Refill: 1  2. Hyperlipidemia with target LDL less than  100 Low fat diet - Lipid panel - simvastatin (ZOCOR) 20 MG tablet; Take 1 tablet (20 mg total) by mouth daily.  Dispense: 90 tablet; Refill: 1 - fenofibrate (TRICOR) 145 MG tablet; Take 1 tablet (145 mg total) by mouth daily.  Dispense: 90 tablet; Refill: 1  3. Metabolic syndrome Watch carbs in diet - Bayer DCA Hb A1c Waived - Microalbumin / creatinine urine ratio  4.  Acute gout of right foot, unspecified cause Low purine diet - Uric acid - colchicine 0.6 MG tablet; 2 tablets PO at gout onswet, may repeat 1x in 1 hour. No more then 3 per day.  Dispense: 30 tablet; Refill: 1  5. Chronic kidney disease (CKD) stage G3a/A1, moderately decreased glomerular filtration rate (GFR) between 45-59 mL/min/1.73 square meter and albuminuria creatinine ratio less than 30 mg/g Labs pending  6. Bradycardia  7. Morbid obesity ([place]) Discussed diet and exercise for person with BMI >25 Will recheck weight in 3-6 months     Labs pending Health Maintenance reviewed Diet and exercise encouraged  Follow up plan: 6 months   [name] Done, FNP

## 2019-09-02 LAB — CBC WITH DIFFERENTIAL/PLATELET
Basophils Absolute: 0 10*3/uL (ref 0.0–0.2)
Basos: 1 %
EOS (ABSOLUTE): 0.1 10*3/uL (ref 0.0–0.4)
Eos: 1 %
Hematocrit: 37.2 % — ABNORMAL LOW (ref 37.5–51.0)
Hemoglobin: 13.3 g/dL (ref 13.0–17.7)
Immature Grans (Abs): 0 10*3/uL (ref 0.0–0.1)
Immature Granulocytes: 0 %
Lymphocytes Absolute: 2.1 10*3/uL (ref 0.7–3.1)
Lymphs: 26 %
MCH: 31.7 pg (ref 26.6–33.0)
MCHC: 35.8 g/dL — ABNORMAL HIGH (ref 31.5–35.7)
MCV: 89 fL (ref 79–97)
Monocytes Absolute: 1 10*3/uL — ABNORMAL HIGH (ref 0.1–0.9)
Monocytes: 12 %
Neutrophils Absolute: 4.9 10*3/uL (ref 1.4–7.0)
Neutrophils: 60 %
Platelets: 235 10*3/uL (ref 150–450)
RBC: 4.2 x10E6/uL (ref 4.14–5.80)
RDW: 14.2 % (ref 11.6–15.4)
WBC: 8 10*3/uL (ref 3.4–10.8)

## 2019-09-02 LAB — CMP14+EGFR
ALT: 28 IU/L (ref 0–44)
AST: 38 IU/L (ref 0–40)
Albumin/Globulin Ratio: 1.8 (ref 1.2–2.2)
Albumin: 4.5 g/dL (ref 4.0–5.0)
Alkaline Phosphatase: 31 IU/L — ABNORMAL LOW (ref 48–121)
BUN/Creatinine Ratio: 17 (ref 9–20)
BUN: 33 mg/dL — ABNORMAL HIGH (ref 6–24)
Bilirubin Total: 0.4 mg/dL (ref 0.0–1.2)
CO2: 20 mmol/L (ref 20–29)
Calcium: 9.6 mg/dL (ref 8.7–10.2)
Chloride: 102 mmol/L (ref 96–106)
Creatinine, Ser: 1.96 mg/dL — ABNORMAL HIGH (ref 0.76–1.27)
GFR calc Af Amer: 45 mL/min/{1.73_m2} — ABNORMAL LOW (ref >59)
GFR calc non Af Amer: 39 mL/min/{1.73_m2} — ABNORMAL LOW (ref >59)
Globulin, Total: 2.5 g/dL (ref 1.5–4.5)
Glucose: 93 mg/dL (ref 65–99)
Potassium: 4.4 mmol/L (ref 3.5–5.2)
Sodium: 138 mmol/L (ref 134–144)
Total Protein: 7 g/dL (ref 6.0–8.5)

## 2019-09-02 LAB — MICROALBUMIN / CREATININE URINE RATIO
Creatinine, Urine: 180.3 mg/dL
Microalb/Creat Ratio: 116 mg/g creat — ABNORMAL HIGH (ref 0–29)
Microalbumin, Urine: 209.1 ug/mL

## 2019-09-02 LAB — LIPID PANEL
Chol/HDL Ratio: 4.8 ratio (ref 0.0–5.0)
Cholesterol, Total: 164 mg/dL (ref 100–199)
HDL: 34 mg/dL — ABNORMAL LOW (ref >39)
LDL Chol Calc (NIH): 103 mg/dL — ABNORMAL HIGH (ref 0–99)
Triglycerides: 152 mg/dL — ABNORMAL HIGH (ref 0–149)
VLDL Cholesterol Cal: 27 mg/dL (ref 5–40)

## 2019-09-02 LAB — URIC ACID: Uric Acid: 7.2 mg/dL (ref 3.8–8.4)

## 2019-09-16 NOTE — Addendum Note (Signed)
Addended by: Vennie Homans on: 09/16/2019 02:20 PM   Modules accepted: Orders

## 2019-12-11 IMAGING — DX DG CHEST 2V
2 series · 3 of 3 positions shown · non-contrast
Comparison: None.

CLINICAL DATA: Chest for since [DATE] today.

EXAM:
CHEST - 2 VIEW

[Series 1: chest pa · 0.14mm/px · 2 of 2 slices shown]
[im 1/2]
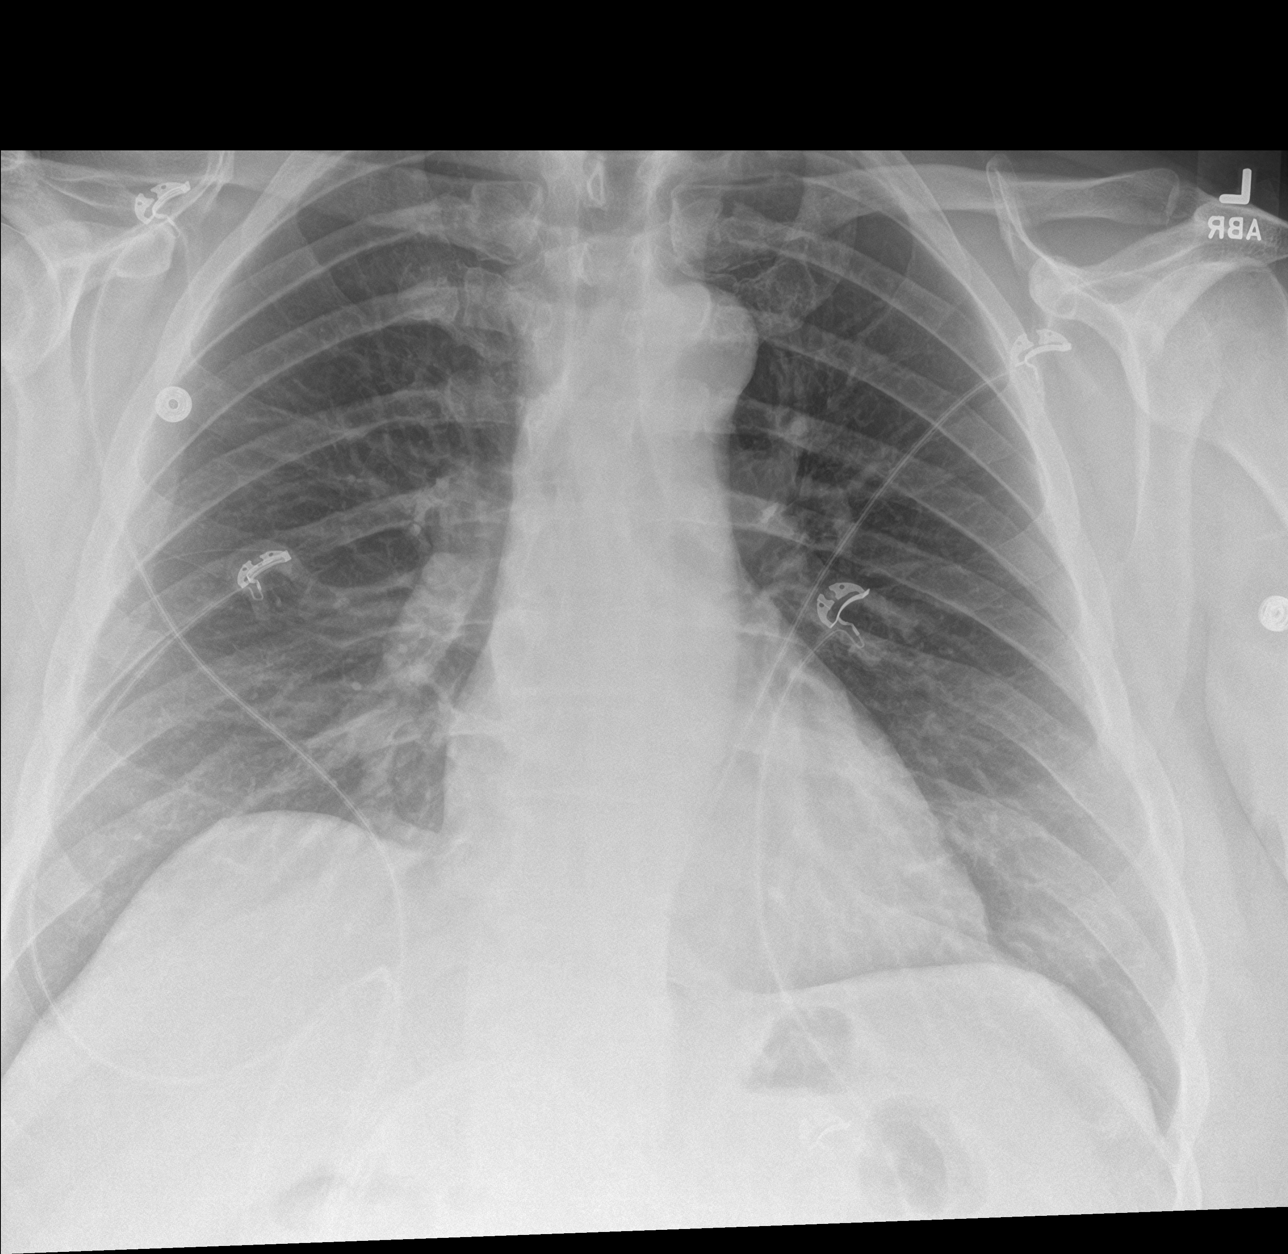
[im 2/2]
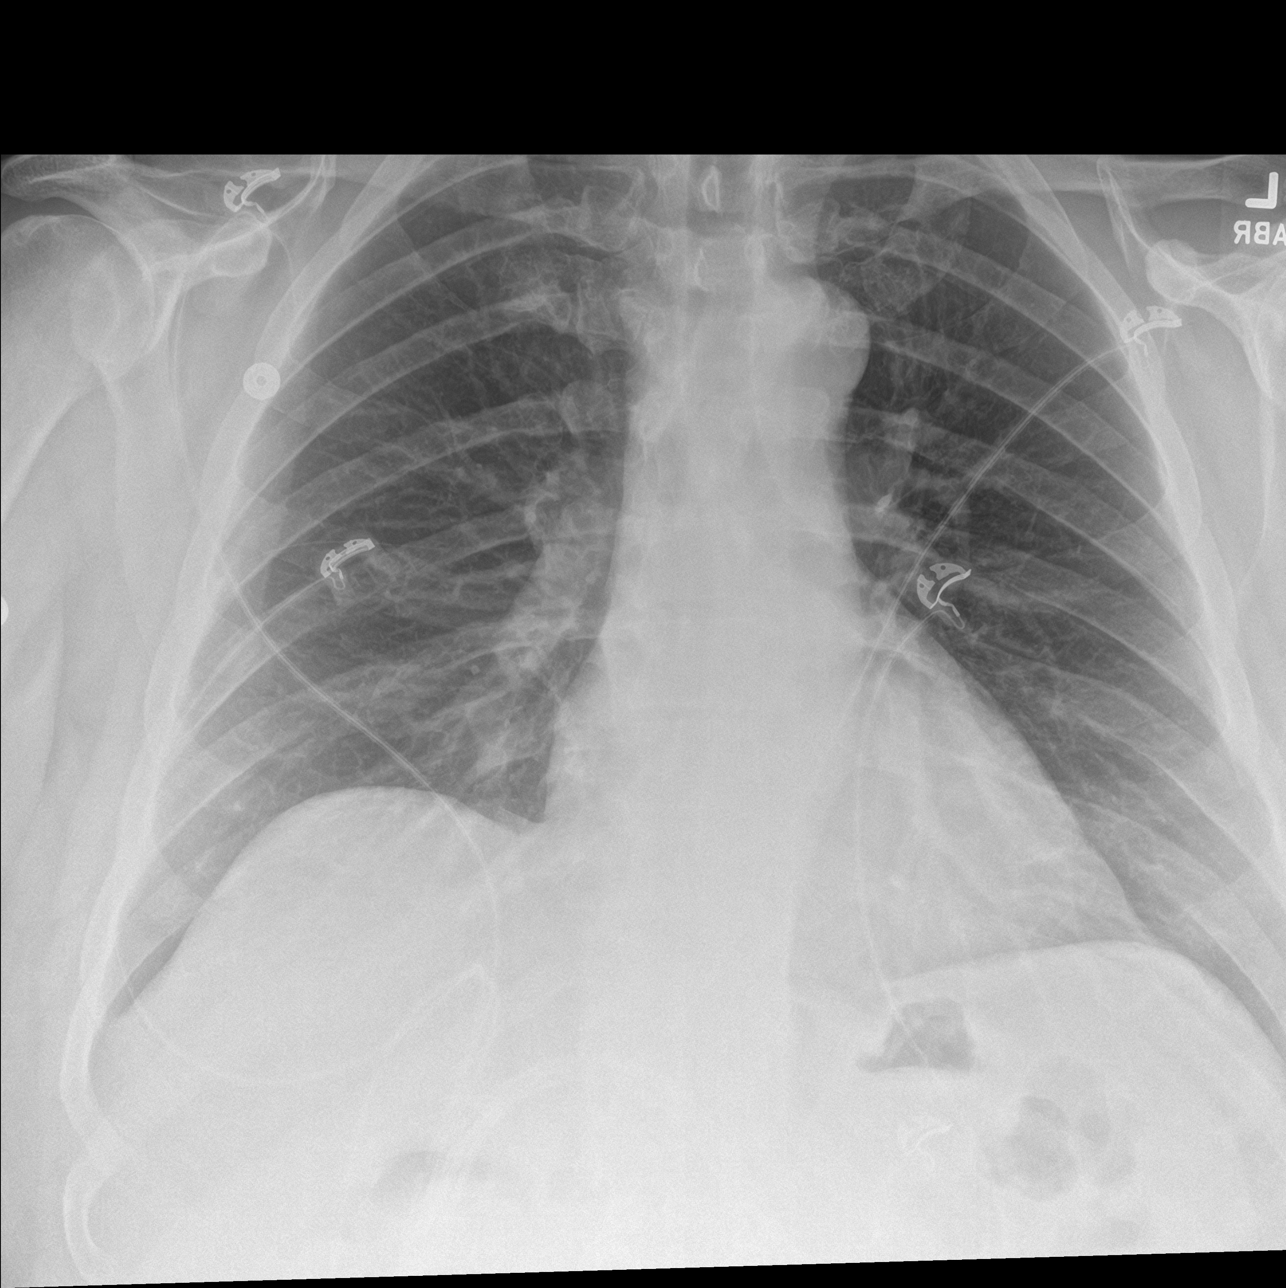

[chest lat]
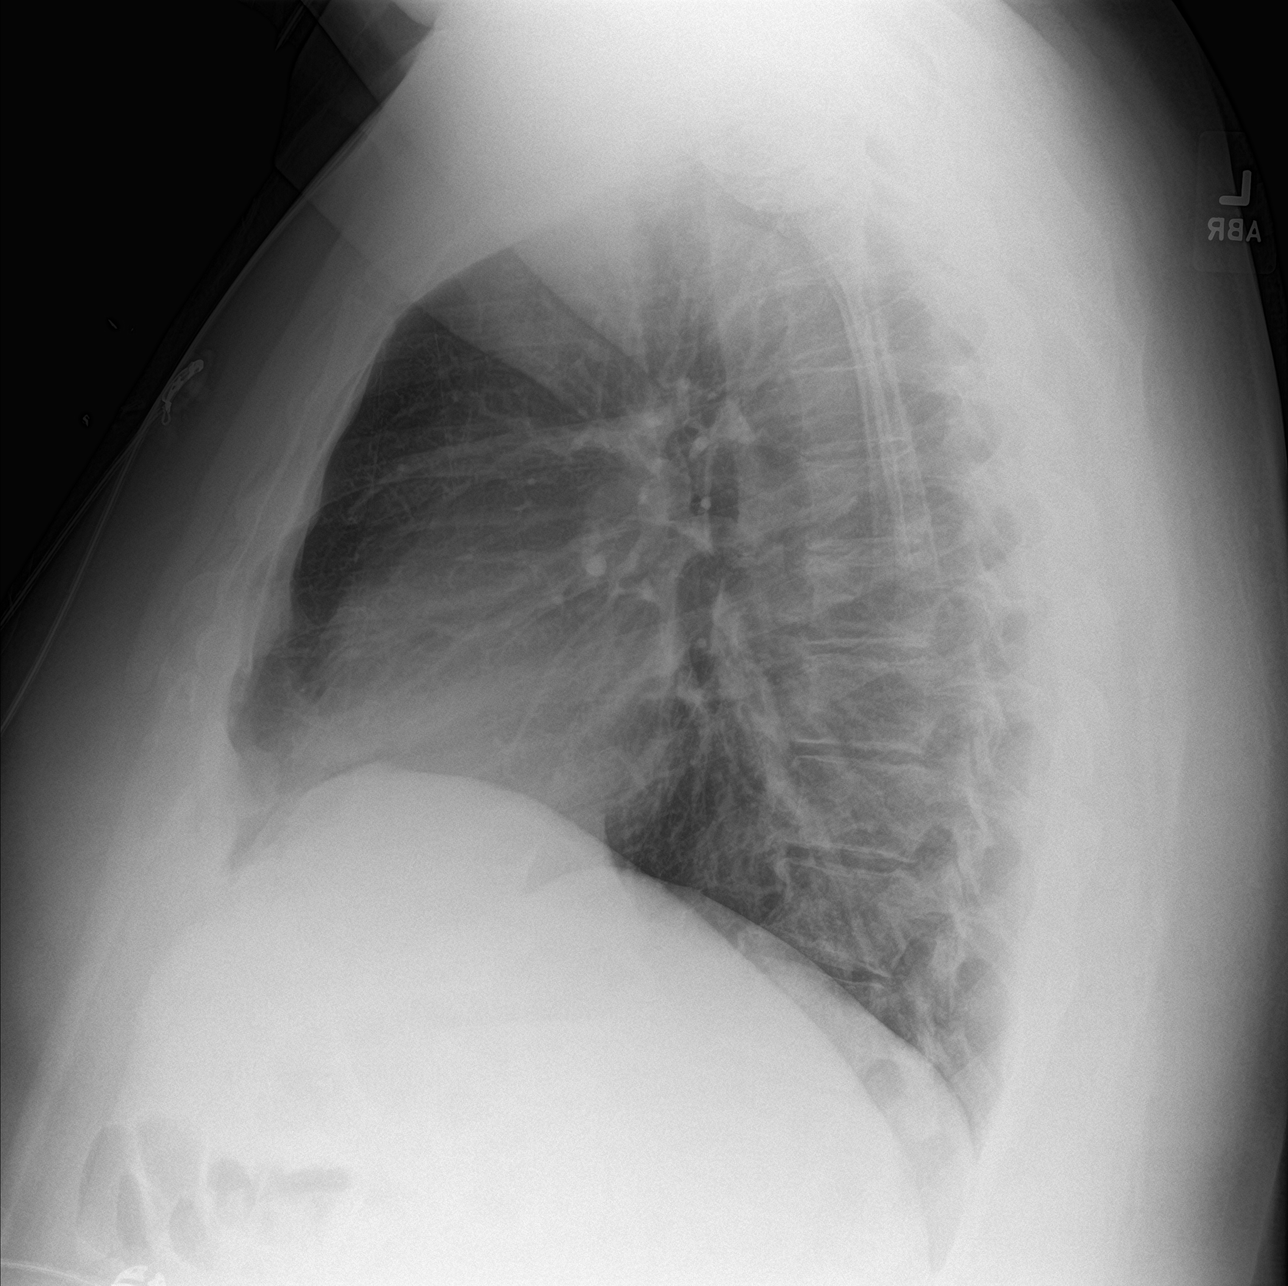

[3 of 3 positions shown; findings below may reference images not displayed]

FINDINGS: Heart size is top-normal. There is slight uncoiling of the thoracic
aorta without aneurysmal dilatation. Mild elevation or eventration
of the right hemidiaphragm is noted. No acute pulmonary
consolidation, CHF, effusion or pneumothorax. No acute osseous
abnormality.
IMPRESSION: No active cardiopulmonary disease.

## 2019-12-19 DIAGNOSIS — Z23 Encounter for immunization: Secondary | ICD-10-CM | POA: Diagnosis not present

## 2020-01-12 DIAGNOSIS — Z23 Encounter for immunization: Secondary | ICD-10-CM | POA: Diagnosis not present

## 2020-02-14 ENCOUNTER — Other Ambulatory Visit: Payer: Self-pay | Admitting: Nurse Practitioner

## 2020-02-14 DIAGNOSIS — I10 Essential (primary) hypertension: Secondary | ICD-10-CM

## 2020-02-22 NOTE — Progress Notes (Signed)
Cardiology Office Note   Date:  02/23/2020   ID:  Ian Stewart, DOB 09/07/1970, MRN [id number]  PCP:  Ian Pretty, FNP  Cardiologist:   No primary care provider on file.   Chief Complaint  Patient presents with   Cardiomyopathy      History of Present Illness: Ian Stewart is a 49 y.o. male who was referred by Ian Pretty, FNP for evaluation of  bradycardia.  This was noted on his Apple Watch.  He was seen in the ED in October for this prior to our last visit.  He had some chest pain and had a negative POET (Plain Old Exercise Treadmill). He has had bradycardia but he is not having any symptoms with this.  Most of this was at night.    Today he comes back and says he is feeling well.  Denies any tachypalpitations.  He has had no shortness of breath, PND or orthopnea.  He has had no new edema.  Does have weight gain after having lost weight.  He was under a lot of stress.  His mom did die in August and he was helping to care for her.  He had a brother with a broken ankle but he has not gotten over that.  He is not doing much walking except at work.  With that he denies any cardiovascular symptoms.  Past Medical History:  Diagnosis Date   Arrhythmia    BRADYCARDIA/ 41-50 BEATS   Chronic kidney disease    monitoring kidey function due to other medications   Hyperlipidemia    Hypertension     Past Surgical History:  Procedure Laterality Date   RETINAL DETACHMENT SURGERY       Current Outpatient Medications  Medication Sig Dispense Refill   acetaminophen (TYLENOL) 500 MG tablet Take 1,000 mg by mouth every 6 (six) hours as needed for mild pain or moderate pain.     amLODipine (NORVASC) 10 MG tablet Take 1 tablet (10 mg total) by mouth daily. 90 tablet 1   aspirin 81 MG tablet Take 81 mg by mouth daily.     colchicine 0.6 MG tablet 2 tablets PO at gout onswet, may repeat 1x in 1 hour. No more then 3 per day. 30 tablet 1   fenofibrate (TRICOR) 145  MG tablet Take 1 tablet (145 mg total) by mouth daily. 90 tablet 1   fluticasone (FLONASE) 50 MCG/ACT nasal spray Place 2 sprays into both nostrils every morning.      Multiple Vitamin (MULTIVITAMIN) capsule Take 1 capsule by mouth every morning.      simvastatin (ZOCOR) 20 MG tablet Take 1 tablet (20 mg total) by mouth daily. 90 tablet 1   spironolactone (ALDACTONE) 25 MG tablet Take 1 tablet (25 mg total) by mouth daily. 90 tablet 3   valsartan-hydrochlorothiazide (DIOVAN-HCT) 320-25 MG tablet TAKE 1 TABLET BY MOUTH  DAILY 90 tablet 0   metoprolol succinate (TOPROL-XL) 100 MG 24 hr tablet Take 1 tablet (100 mg total) by mouth daily. Take with or immediately following a meal. 90 tablet 1   No current facility-administered medications for this visit.    Allergies:   Lipitor [atorvastatin]    ROS:  Please see the history of present illness.   Otherwise, review of systems are positive for none.   All other systems are reviewed and negative.    PHYSICAL EXAM: VS:  BP 132/83    Pulse 73    Temp 97.9 F (36.6 C)  Ht 6\' 1"  (1.854 m)    Wt (!) 337 lb 9.6 oz (153.1 kg)    SpO2 96%    BMI 44.54 kg/m  , BMI Body mass index is 44.54 kg/m. GENERAL:  Well appearing NECK:  No jugular venous distention, waveform within normal limits, carotid upstroke brisk and symmetric, no bruits, no thyromegaly LUNGS:  Clear to auscultation bilaterally CHEST:  Unremarkable HEART:  PMI not displaced or sustained,S1 and S2 within normal limits, no S3, no S4, no clicks, no rubs, no murmurs ABD:  Flat, positive bowel sounds normal in frequency in pitch, no bruits, no rebound, no guarding, no midline pulsatile mass, no hepatomegaly, no splenomegaly EXT:  2 plus pulses throughout, no edema, no cyanosis no clubbing   EKG:  EKG is not ordered today. NA   Recent Labs: 06/27/2019: TSH 1.360 09/01/2019: ALT 28; BUN 33; Creatinine, Ser 1.96; Hemoglobin 13.3; Platelets 235; Potassium 4.4; Sodium 138    Lipid  Panel    Component Value Date/Time   CHOL 164 09/01/2019 0903   TRIG 152 (H) 09/01/2019 0903   TRIG 263 (H) 07/20/2014 1450   HDL 34 (L) 09/01/2019 0903   HDL 37 (L) 07/20/2014 1450   CHOLHDL 4.8 09/01/2019 0903   LDLCALC 103 (H) 09/01/2019 0903      Wt Readings from Last 3 Encounters:  02/23/20 (!) 337 lb 9.6 oz (153.1 kg)  09/01/19 (!) 306 lb (138.8 kg)  08/29/19 (!) 307 lb (139.3 kg)      Other studies Reviewed: Additional studies/ records that were reviewed today include:  Labs Review of the above records demonstrates:  BMET   ASSESSMENT AND PLAN:  HTN:  This is being managed in the context of treating his CHF  CARDIOMYOPATHY:    He has no symptoms.  He tolerates current medications.  I will follow up in the future with a repeat echocardiogram.  He did have a negative stress test.  If he has continued cardiomyopathy after maximal medical therapy at night consider further ischemia testing.  BRADYCARDIA:   He tolerates his bradycardia.  No change in therapy.    DYSLIPIDEMIA:  Lipids were mildly elevated and we have talked about diet that could help with this.    OBESITY:     He had lost weight but unfortunately gained it back.  He understands diet and exercise recommendations.  CKD: His creatinine last was 1.96.  I am going to ask him to have a basic metabolic profile drawn at his primary care office.  We may have to back down on his ACE inhibitor.  COVID EDUCATION: He has been vaccinated.  Current medicines are reviewed at length with the patient today.  The patient does not have concerns regarding medicines.  The following changes have been made:  no change  Labs/ tests ordered today include: None  Orders Placed This Encounter  Procedures   Basic metabolic panel     Disposition:   FU with Ian Sims DNP in 2 months.     Signed, Minus Breeding, MD  02/23/2020 9:29 AM    [facility]

## 2020-02-23 ENCOUNTER — Encounter: Payer: Self-pay | Admitting: Cardiology

## 2020-02-23 ENCOUNTER — Other Ambulatory Visit: Payer: Self-pay

## 2020-02-23 ENCOUNTER — Ambulatory Visit: Payer: BC Managed Care – PPO | Admitting: Cardiology

## 2020-02-23 VITALS — BP 132/83 | HR 73 | Temp 97.9°F | Ht 73.0 in | Wt 337.6 lb

## 2020-02-23 DIAGNOSIS — Z79899 Other long term (current) drug therapy: Secondary | ICD-10-CM

## 2020-02-23 DIAGNOSIS — I420 Dilated cardiomyopathy: Secondary | ICD-10-CM | POA: Diagnosis not present

## 2020-02-23 DIAGNOSIS — E785 Hyperlipidemia, unspecified: Secondary | ICD-10-CM

## 2020-02-23 DIAGNOSIS — R001 Bradycardia, unspecified: Secondary | ICD-10-CM | POA: Diagnosis not present

## 2020-02-23 NOTE — Patient Instructions (Addendum)
Medication Instructions:  No changes *If you need a refill on your cardiac medications before your next appointment, please call your pharmacy*  Lab Work: BMP to be done at your primary care physician's office  Testing/Procedures: None ordered this visit  Follow-Up: At [facility], you and your health needs are our priority.  As part of our continuing mission to provide you with exceptional heart care, we have created designated Provider Care Teams.  These Care Teams include your primary Cardiologist (physician) and Advanced Practice Providers (APPs -  Physician Assistants and Nurse Practitioners) who all work together to provide you with the care you need, when you need it.  Your next appointment:   12 month(s)  You will receive a reminder letter in the mail two months in advance. If you don't receive a letter, please call our office to schedule the follow-up appointment.  The format for your next appointment:   In Person  Provider:   Minus Breeding, MD

## 2020-02-24 ENCOUNTER — Ambulatory Visit: Payer: BC Managed Care – PPO | Admitting: Cardiology

## 2020-03-01 ENCOUNTER — Other Ambulatory Visit: Payer: Self-pay | Admitting: Nurse Practitioner

## 2020-03-01 DIAGNOSIS — I10 Essential (primary) hypertension: Secondary | ICD-10-CM

## 2020-03-06 ENCOUNTER — Encounter: Payer: Self-pay | Admitting: Nurse Practitioner

## 2020-03-06 ENCOUNTER — Other Ambulatory Visit: Payer: Self-pay

## 2020-03-06 ENCOUNTER — Ambulatory Visit: Payer: BC Managed Care – PPO | Admitting: Nurse Practitioner

## 2020-03-06 VITALS — BP 143/97 | HR 65 | Temp 98.2°F | Ht 73.0 in | Wt 336.0 lb

## 2020-03-06 DIAGNOSIS — Z125 Encounter for screening for malignant neoplasm of prostate: Secondary | ICD-10-CM | POA: Diagnosis not present

## 2020-03-06 DIAGNOSIS — R001 Bradycardia, unspecified: Secondary | ICD-10-CM

## 2020-03-06 DIAGNOSIS — E8881 Metabolic syndrome: Secondary | ICD-10-CM

## 2020-03-06 DIAGNOSIS — E785 Hyperlipidemia, unspecified: Secondary | ICD-10-CM

## 2020-03-06 DIAGNOSIS — I10 Essential (primary) hypertension: Secondary | ICD-10-CM | POA: Diagnosis not present

## 2020-03-06 DIAGNOSIS — N1831 Chronic kidney disease, stage 3a: Secondary | ICD-10-CM

## 2020-03-06 LAB — CMP14+EGFR
ALT: 27 IU/L (ref 0–44)
AST: 28 IU/L (ref 0–40)
Albumin/Globulin Ratio: 1.8 (ref 1.2–2.2)
Albumin: 4.4 g/dL (ref 3.8–4.9)
Alkaline Phosphatase: 37 IU/L — ABNORMAL LOW (ref 44–121)
BUN/Creatinine Ratio: 18 (ref 9–20)
BUN: 34 mg/dL — ABNORMAL HIGH (ref 6–24)
Bilirubin Total: 0.2 mg/dL (ref 0.0–1.2)
CO2: 22 mmol/L (ref 20–29)
Calcium: 9.8 mg/dL (ref 8.7–10.2)
Chloride: 104 mmol/L (ref 96–106)
Creatinine, Ser: 1.9 mg/dL — ABNORMAL HIGH (ref 0.76–1.27)
GFR calc Af Amer: 46 mL/min/{1.73_m2} — ABNORMAL LOW (ref >59)
GFR calc non Af Amer: 40 mL/min/{1.73_m2} — ABNORMAL LOW (ref >59)
Globulin, Total: 2.5 g/dL (ref 1.5–4.5)
Glucose: 130 mg/dL — ABNORMAL HIGH (ref 65–99)
Potassium: 4.7 mmol/L (ref 3.5–5.2)
Sodium: 139 mmol/L (ref 134–144)
Total Protein: 6.9 g/dL (ref 6.0–8.5)

## 2020-03-06 LAB — CBC WITH DIFFERENTIAL/PLATELET
Basophils Absolute: 0 10*3/uL (ref 0.0–0.2)
Basos: 1 %
EOS (ABSOLUTE): 0.1 10*3/uL (ref 0.0–0.4)
Eos: 2 %
Hematocrit: 39.1 % (ref 37.5–51.0)
Hemoglobin: 13.4 g/dL (ref 13.0–17.7)
Immature Grans (Abs): 0 10*3/uL (ref 0.0–0.1)
Immature Granulocytes: 0 %
Lymphocytes Absolute: 1.9 10*3/uL (ref 0.7–3.1)
Lymphs: 38 %
MCH: 30.7 pg (ref 26.6–33.0)
MCHC: 34.3 g/dL (ref 31.5–35.7)
MCV: 90 fL (ref 79–97)
Monocytes Absolute: 0.5 10*3/uL (ref 0.1–0.9)
Monocytes: 10 %
Neutrophils Absolute: 2.4 10*3/uL (ref 1.4–7.0)
Neutrophils: 49 %
Platelets: 231 10*3/uL (ref 150–450)
RBC: 4.37 x10E6/uL (ref 4.14–5.80)
RDW: 13.5 % (ref 11.6–15.4)
WBC: 5 10*3/uL (ref 3.4–10.8)

## 2020-03-06 LAB — BAYER DCA HB A1C WAIVED: HB A1C (BAYER DCA - WAIVED): 6.3 % (ref <7.0)

## 2020-03-06 LAB — LIPID PANEL
Chol/HDL Ratio: 7.1 ratio — ABNORMAL HIGH (ref 0.0–5.0)
Cholesterol, Total: 206 mg/dL — ABNORMAL HIGH (ref 100–199)
HDL: 29 mg/dL — ABNORMAL LOW (ref >39)
LDL Chol Calc (NIH): 122 mg/dL — ABNORMAL HIGH (ref 0–99)
Triglycerides: 309 mg/dL — ABNORMAL HIGH (ref 0–149)
VLDL Cholesterol Cal: 55 mg/dL — ABNORMAL HIGH (ref 5–40)

## 2020-03-06 MED ORDER — SPIRONOLACTONE 25 MG PO TABS: 25.0000 mg | ORAL_TABLET | Freq: Every day | ORAL | 1 refills | Status: DC

## 2020-03-06 MED ORDER — FENOFIBRATE 145 MG PO TABS: 145.0000 mg | ORAL_TABLET | Freq: Every day | ORAL | 1 refills | Status: DC

## 2020-03-06 MED ORDER — VALSARTAN-HYDROCHLOROTHIAZIDE 320-25 MG PO TABS: 1.0000 | ORAL_TABLET | Freq: Every day | ORAL | 1 refills | Status: DC

## 2020-03-06 MED ORDER — METOPROLOL SUCCINATE ER 100 MG PO TB24: 100.0000 mg | ORAL_TABLET | Freq: Every day | ORAL | 1 refills | Status: DC

## 2020-03-06 MED ORDER — AMLODIPINE BESYLATE 10 MG PO TABS: 10.0000 mg | ORAL_TABLET | Freq: Every day | ORAL | 1 refills | Status: DC

## 2020-03-06 MED ORDER — SIMVASTATIN 20 MG PO TABS: 20.0000 mg | ORAL_TABLET | Freq: Every day | ORAL | 1 refills | Status: DC

## 2020-03-06 NOTE — Progress Notes (Signed)
Subjective:    Patient ID: Ian Stewart, male    DOB: 1971-02-26, 49 y.o.   MRN: [id number]   Chief Complaint: Medical Management of Chronic Issues    HPI:  1. Essential hypertension No c/o chest pain, sob or headache.does not check blood pressure at home. BP Readings from Last 3 Encounters:  03/06/20 (!) 143/97  02/23/20 132/83  09/01/19 (!) 129/91     2. Hyperlipidemia with target LDL less than 100 Does not watch diet and does no dedicated exercise. Lab Results  Component Value Date   CHOL 164 09/01/2019   HDL 34 (L) 09/01/2019   LDLCALC 103 (H) 09/01/2019   TRIG 152 (H) 09/01/2019   CHOLHDL 4.8 09/01/2019     3. Metabolic syndrome He doe snot check hhis blood sugars at home. He does not really watch diet. Lab Results  Component Value Date   HGBA1C 5.5 09/01/2019     4. Screening for prostate cancer No problems voiding. Had PSA done at work and was .21    5. Chronic kidney disease (CKD) stage G3a/A1, moderately decreased glomerular filtration rate (GFR) between 45-59 mL/min/1.73 square meter and albuminuria creatinine ratio less than 30 mg/g (HCC) Again no problems voiding and no edema. Lab Results  Component Value Date   CREATININE 1.96 (H) 09/01/2019     6. Bradycardia Denies any fatigue or near syncopial episode.  7. Morbid obesity ([place]) No recent weight changes. He fluctates up and down. Wt Readings from Last 3 Encounters:  03/06/20 (!) 336 lb (152.4 kg)  02/23/20 (!) 337 lb 9.6 oz (153.1 kg)  09/01/19 (!) 306 lb (138.8 kg)   BMI Readings from Last 3 Encounters:  03/06/20 44.33 kg/m  02/23/20 44.54 kg/m  09/01/19 40.37 kg/m       Outpatient Encounter Medications as of 03/06/2020  Medication Sig  . acetaminophen (TYLENOL) 500 MG tablet Take 1,000 mg by mouth every 6 (six) hours as needed for mild pain or moderate pain.  Marland Kitchen amLODipine (NORVASC) 10 MG tablet TAKE 1 TABLET BY MOUTH  DAILY  . Ascorbic Acid (VITAMIN C) 500 MG CAPS Take by  mouth.  Marland Kitchen aspirin 81 MG tablet Take 81 mg by mouth daily.  . Cholecalciferol (DIALYVITE VITAMIN D 5000 PO) Take by mouth.  . colchicine 0.6 MG tablet 2 tablets PO at gout onswet, may repeat 1x in 1 hour. No more then 3 per day.  . fenofibrate (TRICOR) 145 MG tablet Take 1 tablet (145 mg total) by mouth daily.  . Ferrous Sulfate 50 MG TBCR Take by mouth.  . fluticasone (FLONASE) 50 MCG/ACT nasal spray Place 2 sprays into both nostrils every morning.   . Multiple Vitamin (MULTIVITAMIN) capsule Take 1 capsule by mouth every morning.   . simvastatin (ZOCOR) 20 MG tablet Take 1 tablet (20 mg total) by mouth daily.  Marland Kitchen spironolactone (ALDACTONE) 25 MG tablet Take 1 tablet (25 mg total) by mouth daily.  . valsartan-hydrochlorothiazide (DIOVAN-HCT) 320-25 MG tablet TAKE 1 TABLET BY MOUTH  DAILY  . metoprolol succinate (TOPROL-XL) 100 MG 24 hr tablet Take 1 tablet (100 mg total) by mouth daily. Take with or immediately following a meal.    Past Surgical History:  Procedure Laterality Date  . RETINAL DETACHMENT SURGERY      Family History  Problem Relation Age of Onset  . CAD Mother 61  . CAD Father 79       CABG, died age 86  . Colon cancer Neg Hx   .  Esophageal cancer Neg Hx   . Stomach cancer Neg Hx   . Rectal cancer Neg Hx     New complaints: None today  Social history: Lives by hisself  Controlled substance contract: n/a    Review of Systems  Constitutional: Negative for diaphoresis.  Eyes: Negative for pain.  Respiratory: Negative for shortness of breath.   Cardiovascular: Negative for chest pain, palpitations and leg swelling.  Gastrointestinal: Negative for abdominal pain.  Endocrine: Negative for polydipsia.  Skin: Negative for rash.  Neurological: Negative for dizziness, weakness and headaches.  Hematological: Does not bruise/bleed easily.  All other systems reviewed and are negative.      Objective:   Physical Exam Vitals and nursing note reviewed.   Constitutional:      Appearance: Normal appearance. He is well-developed and well-nourished.  HENT:     Head: Normocephalic.     Nose: Nose normal.     Mouth/Throat:     Mouth: Oropharynx is clear and moist.  Eyes:     Extraocular Movements: EOM normal.     Pupils: Pupils are equal, round, and reactive to light.  Neck:     Thyroid: No thyroid mass or thyromegaly.     Vascular: No carotid bruit or JVD.     Trachea: Phonation normal.  Cardiovascular:     Rate and Rhythm: Normal rate and regular rhythm.  Pulmonary:     Effort: Pulmonary effort is normal. No respiratory distress.     Breath sounds: Normal breath sounds.  Abdominal:     General: Bowel sounds are normal. Aorta is normal.     Palpations: Abdomen is soft.     Tenderness: There is no abdominal tenderness.  Musculoskeletal:        General: Normal range of motion.     Cervical back: Normal range of motion and neck supple.  Lymphadenopathy:     Cervical: No cervical adenopathy.  Skin:    General: Skin is warm and dry.  Neurological:     Mental Status: He is alert and oriented to person, place, and time.  Psychiatric:        Mood and Affect: Mood and affect normal.        Behavior: Behavior normal.        Thought Content: Thought content normal.        Judgment: Judgment normal.    BP (!) 143/97   Pulse 65   Temp 98.2 F (36.8 C) (Temporal)   Ht 6' 1"  (1.854 m)   Wt (!) 336 lb (152.4 kg)   SpO2 96%   BMI 44.33 kg/m    hgba1c 6.3%      Assessment & Plan:  Ian Stewart comes in today with chief complaint of Medical Management of Chronic Issues   Diagnosis and orders addressed:  1. Essential hypertension Low sodium diet - CBC with Differential/Platelet - CMP14+EGFR - amLODipine (NORVASC) 10 MG tablet; Take 1 tablet (10 mg total) by mouth daily.  Dispense: 90 tablet; Refill: 1 - valsartan-hydrochlorothiazide (DIOVAN-HCT) 320-25 MG tablet; Take 1 tablet by mouth daily.  Dispense: 90 tablet; Refill:  1 - spironolactone (ALDACTONE) 25 MG tablet; Take 1 tablet (25 mg total) by mouth daily.  Dispense: 90 tablet; Refill: 1 - metoprolol succinate (TOPROL-XL) 100 MG 24 hr tablet; Take 1 tablet (100 mg total) by mouth daily. Take with or immediately following a meal.  Dispense: 90 tablet; Refill: 1  2. Hyperlipidemia with target LDL less than 100 Low fat diet - Lipid  panel - simvastatin (ZOCOR) 20 MG tablet; Take 1 tablet (20 mg total) by mouth daily.  Dispense: 90 tablet; Refill: 1 - fenofibrate (TRICOR) 145 MG tablet; Take 1 tablet (145 mg total) by mouth daily.  Dispense: 90 tablet; Refill: 1  3. Metabolic syndrome Continue to watch carbs in diet - Bayer DCA Hb A1c Waived  4. Screening for prostate cancer Work labs reviewed  5. Chronic kidney disease (CKD) stage G3a/A1, moderately decreased glomerular filtration rate (GFR) between 45-59 mL/min/1.73 square meter and albuminuria creatinine ratio less than 30 mg/g (HCC) Labs pending  6. Bradycardia  7. Morbid obesity ([place]) Discussed diet and exercise for person with BMI >25 Will recheck weight in 3-6 months   Labs pending Health Maintenance reviewed Diet and exercise encouraged  Follow up plan: 6 months   [name] Done, FNP

## 2020-03-06 NOTE — Patient Instructions (Signed)
Carbohydrate Counting for Diabetes Mellitus, Adult  Carbohydrate counting is a method of keeping track of how many carbohydrates you eat. Eating carbohydrates naturally increases the amount of sugar (glucose) in the blood. Counting how many carbohydrates you eat helps keep your blood glucose within normal limits, which helps you manage your diabetes (diabetes mellitus). It is important to know how many carbohydrates you can safely have in each meal. This is different for every person. A diet and nutrition specialist (registered dietitian) can help you make a meal plan and calculate how many carbohydrates you should have at each meal and snack. Carbohydrates are found in the following foods:  Grains, such as breads and cereals.  Dried beans and soy products.  Starchy vegetables, such as potatoes, peas, and corn.  Fruit and fruit juices.  Milk and yogurt.  Sweets and snack foods, such as cake, cookies, candy, chips, and soft drinks. How do I count carbohydrates? There are two ways to count carbohydrates in food. You can use either of the methods or a combination of both. Reading "Nutrition Facts" on packaged food The "Nutrition Facts" list is included on the labels of almost all packaged foods and beverages in the [place] It includes:  The serving size.  Information about nutrients in each serving, including the grams (g) of carbohydrate per serving. To use the "Nutrition Facts":  Decide how many servings you will have.  Multiply the number of servings by the number of carbohydrates per serving.  The resulting number is the total amount of carbohydrates that you will be having. Learning standard serving sizes of other foods When you eat carbohydrate foods that are not packaged or do not include "Nutrition Facts" on the label, you need to measure the servings in order to count the amount of carbohydrates:  Measure the foods that you will eat with a food scale or measuring cup, if  needed.  Decide how many standard-size servings you will eat.  Multiply the number of servings by 15. Most carbohydrate-rich foods have about 15 g of carbohydrates per serving. ? For example, if you eat 8 oz (170 g) of strawberries, you will have eaten 2 servings and 30 g of carbohydrates (2 servings x 15 g = 30 g).  For foods that have more than one food mixed, such as soups and casseroles, you must count the carbohydrates in each food that is included. The following list contains standard serving sizes of common carbohydrate-rich foods. Each of these servings has about 15 g of carbohydrates:   hamburger bun or  English muffin.   oz (15 mL) syrup.   oz (14 g) jelly.  1 slice of bread.  1 six-inch tortilla.  3 oz (85 g) cooked rice or pasta.  4 oz (113 g) cooked dried beans.  4 oz (113 g) starchy vegetable, such as peas, corn, or potatoes.  4 oz (113 g) hot cereal.  4 oz (113 g) mashed potatoes or  of a large baked potato.  4 oz (113 g) canned or frozen fruit.  4 oz (120 mL) fruit juice.  4-6 crackers.  6 chicken nuggets.  6 oz (170 g) unsweetened dry cereal.  6 oz (170 g) plain fat-free yogurt or yogurt sweetened with artificial sweeteners.  8 oz (240 mL) milk.  8 oz (170 g) fresh fruit or one small piece of fruit.  24 oz (680 g) popped popcorn. Example of carbohydrate counting Sample meal  3 oz (85 g) chicken breast.  6 oz (170 g)   brown rice.  4 oz (113 g) corn.  8 oz (240 mL) milk.  8 oz (170 g) strawberries with sugar-free whipped topping. Carbohydrate calculation 1. Identify the foods that contain carbohydrates: ? Rice. ? Corn. ? Milk. ? Strawberries. 2. Calculate how many servings you have of each food: ? 2 servings rice. ? 1 serving corn. ? 1 serving milk. ? 1 serving strawberries. 3. Multiply each number of servings by 15 g: ? 2 servings rice x 15 g = 30 g. ? 1 serving corn x 15 g = 15 g. ? 1 serving milk x 15 g = 15 g. ? 1  serving strawberries x 15 g = 15 g. 4. Add together all of the amounts to find the total grams of carbohydrates eaten: ? 30 g + 15 g + 15 g + 15 g = 75 g of carbohydrates total. Summary  Carbohydrate counting is a method of keeping track of how many carbohydrates you eat.  Eating carbohydrates naturally increases the amount of sugar (glucose) in the blood.  Counting how many carbohydrates you eat helps keep your blood glucose within normal limits, which helps you manage your diabetes.  A diet and nutrition specialist (registered dietitian) can help you make a meal plan and calculate how many carbohydrates you should have at each meal and snack. This information is not intended to replace advice given to you by your health care provider. Make sure you discuss any questions you have with your health care provider. Document Revised: 09/18/2016 Document Reviewed: 08/08/2015 Elsevier Patient Education  2020 Elsevier Inc.  

## 2020-07-07 ENCOUNTER — Other Ambulatory Visit: Payer: Self-pay | Admitting: Nurse Practitioner

## 2020-07-07 DIAGNOSIS — I10 Essential (primary) hypertension: Secondary | ICD-10-CM

## 2020-07-24 ENCOUNTER — Other Ambulatory Visit: Payer: Self-pay | Admitting: Nurse Practitioner

## 2020-07-24 DIAGNOSIS — I10 Essential (primary) hypertension: Secondary | ICD-10-CM

## 2020-08-15 ENCOUNTER — Other Ambulatory Visit: Payer: Self-pay | Admitting: Nurse Practitioner

## 2020-08-15 DIAGNOSIS — E785 Hyperlipidemia, unspecified: Secondary | ICD-10-CM

## 2020-08-15 DIAGNOSIS — I10 Essential (primary) hypertension: Secondary | ICD-10-CM

## 2020-09-06 ENCOUNTER — Other Ambulatory Visit: Payer: Self-pay

## 2020-09-06 ENCOUNTER — Encounter: Payer: Self-pay | Admitting: Nurse Practitioner

## 2020-09-06 ENCOUNTER — Ambulatory Visit: Payer: BC Managed Care – PPO | Admitting: Nurse Practitioner

## 2020-09-06 VITALS — BP 135/94 | HR 72 | Temp 98.3°F | Resp 20 | Ht 73.0 in | Wt 338.0 lb

## 2020-09-06 DIAGNOSIS — M109 Gout, unspecified: Secondary | ICD-10-CM

## 2020-09-06 DIAGNOSIS — E8881 Metabolic syndrome: Secondary | ICD-10-CM | POA: Diagnosis not present

## 2020-09-06 DIAGNOSIS — I10 Essential (primary) hypertension: Secondary | ICD-10-CM | POA: Diagnosis not present

## 2020-09-06 DIAGNOSIS — N1831 Chronic kidney disease, stage 3a: Secondary | ICD-10-CM

## 2020-09-06 DIAGNOSIS — E785 Hyperlipidemia, unspecified: Secondary | ICD-10-CM | POA: Diagnosis not present

## 2020-09-06 LAB — CBC WITH DIFFERENTIAL/PLATELET
Basophils Absolute: 0 10*3/uL (ref 0.0–0.2)
Basos: 1 %
EOS (ABSOLUTE): 0.1 10*3/uL (ref 0.0–0.4)
Eos: 2 %
Hematocrit: 40.6 % (ref 37.5–51.0)
Hemoglobin: 13.2 g/dL (ref 13.0–17.7)
Immature Grans (Abs): 0 10*3/uL (ref 0.0–0.1)
Immature Granulocytes: 0 %
Lymphocytes Absolute: 2 10*3/uL (ref 0.7–3.1)
Lymphs: 31 %
MCH: 29.9 pg (ref 26.6–33.0)
MCHC: 32.5 g/dL (ref 31.5–35.7)
MCV: 92 fL (ref 79–97)
Monocytes Absolute: 0.7 10*3/uL (ref 0.1–0.9)
Monocytes: 10 %
Neutrophils Absolute: 3.6 10*3/uL (ref 1.4–7.0)
Neutrophils: 56 %
Platelets: 228 10*3/uL (ref 150–450)
RBC: 4.41 x10E6/uL (ref 4.14–5.80)
RDW: 14 % (ref 11.6–15.4)
WBC: 6.5 10*3/uL (ref 3.4–10.8)

## 2020-09-06 LAB — CMP14+EGFR
ALT: 22 IU/L (ref 0–44)
AST: 24 IU/L (ref 0–40)
Albumin/Globulin Ratio: 1.8 (ref 1.2–2.2)
Albumin: 4.6 g/dL (ref 3.8–4.9)
Alkaline Phosphatase: 39 IU/L — ABNORMAL LOW (ref 44–121)
BUN/Creatinine Ratio: 18 (ref 9–20)
BUN: 34 mg/dL — ABNORMAL HIGH (ref 6–24)
Bilirubin Total: 0.3 mg/dL (ref 0.0–1.2)
CO2: 21 mmol/L (ref 20–29)
Calcium: 10.1 mg/dL (ref 8.7–10.2)
Chloride: 99 mmol/L (ref 96–106)
Creatinine, Ser: 1.85 mg/dL — ABNORMAL HIGH (ref 0.76–1.27)
Globulin, Total: 2.5 g/dL (ref 1.5–4.5)
Glucose: 136 mg/dL — ABNORMAL HIGH (ref 65–99)
Potassium: 4.5 mmol/L (ref 3.5–5.2)
Sodium: 139 mmol/L (ref 134–144)
Total Protein: 7.1 g/dL (ref 6.0–8.5)
eGFR: 44 mL/min/{1.73_m2} — ABNORMAL LOW (ref >59)

## 2020-09-06 LAB — BAYER DCA HB A1C WAIVED: HB A1C (BAYER DCA - WAIVED): 6.2 % (ref <7.0)

## 2020-09-06 LAB — LIPID PANEL
Chol/HDL Ratio: 8.4 ratio — ABNORMAL HIGH (ref 0.0–5.0)
Cholesterol, Total: 210 mg/dL — ABNORMAL HIGH (ref 100–199)
HDL: 25 mg/dL — ABNORMAL LOW (ref >39)
LDL Chol Calc (NIH): 109 mg/dL — ABNORMAL HIGH (ref 0–99)
Triglycerides: 438 mg/dL — ABNORMAL HIGH (ref 0–149)
VLDL Cholesterol Cal: 76 mg/dL — ABNORMAL HIGH (ref 5–40)

## 2020-09-06 MED ORDER — FENOFIBRATE 145 MG PO TABS: 145.0000 mg | ORAL_TABLET | Freq: Every day | ORAL | 1 refills | Status: DC

## 2020-09-06 MED ORDER — SPIRONOLACTONE 25 MG PO TABS: 25.0000 mg | ORAL_TABLET | Freq: Every day | ORAL | 1 refills | Status: DC

## 2020-09-06 MED ORDER — AMLODIPINE BESYLATE 10 MG PO TABS: 10.0000 mg | ORAL_TABLET | Freq: Every day | ORAL | 0 refills | Status: DC

## 2020-09-06 MED ORDER — METOPROLOL SUCCINATE ER 100 MG PO TB24: ORAL_TABLET | ORAL | 1 refills | Status: DC

## 2020-09-06 MED ORDER — VALSARTAN-HYDROCHLOROTHIAZIDE 320-25 MG PO TABS: 1.0000 | ORAL_TABLET | Freq: Every day | ORAL | 0 refills | Status: DC

## 2020-09-06 MED ORDER — SIMVASTATIN 20 MG PO TABS: 20.0000 mg | ORAL_TABLET | Freq: Every day | ORAL | 0 refills | Status: DC

## 2020-09-06 MED ORDER — COLCHICINE 0.6 MG PO TABS: ORAL_TABLET | ORAL | 1 refills | Status: AC

## 2020-09-06 NOTE — Patient Instructions (Signed)

## 2020-09-06 NOTE — Progress Notes (Signed)
Subjective:    Patient ID: Ian Stewart, male    DOB: 11-19-1970, 50 y.o.   MRN: [id number]   Chief Complaint: Medical Management of Chronic Issues    HPI:  1. Essential hypertension No c/o chest pain, sob or headache. Does not check blood pressure at home. BP Readings from Last 3 Encounters:  09/06/20 (!) 135/94  03/06/20 (!) 143/97  02/23/20 132/83       2. Hyperlipidemia with target LDL less than 100 Does not watch diet and does little to no exercise. He is currently on zocor.  Lab Results  Component Value Date   CHOL 206 (H) 03/06/2020   HDL 29 (L) 03/06/2020   LDLCALC 122 (H) 03/06/2020   TRIG 309 (H) 03/06/2020   CHOLHDL 7.1 (H) 03/06/2020     3. Metabolic syndrome Is currently on diet control and is doing well. He does not check blood sugars daily. Lab Results  Component Value Date   HGBA1C 6.3 03/06/2020     4. Chronic kidney disease (CKD) stage G3a/A1, moderately decreased glomerular filtration rate (GFR) between 45-59 mL/min/1.73 square meter and albuminuria creatinine ratio less than 30 mg/g (HCC) No problems voiding. Lab Results  Component Value Date   CREATININE 1.90 (H) 03/06/2020     5. Morbid obesity ([place]) No recent weight changes Wt Readings from Last 3 Encounters:  09/06/20 (!) 338 lb (153.3 kg)  03/06/20 (!) 336 lb (152.4 kg)  02/23/20 (!) 337 lb 9.6 oz (153.1 kg)   BMI Readings from Last 3 Encounters:  09/06/20 44.59 kg/m  03/06/20 44.33 kg/m  02/23/20 44.54 kg/m       Outpatient Encounter Medications as of 09/06/2020  Medication Sig   acetaminophen (TYLENOL) 500 MG tablet Take 1,000 mg by mouth every 6 (six) hours as needed for mild pain or moderate pain.   amLODipine (NORVASC) 10 MG tablet Take 1 tablet (10 mg total) by mouth daily. (NEEDS TO BE SEEN BEFORE NEXT REFILL)   Ascorbic Acid (VITAMIN C) 500 MG CAPS Take by mouth.   aspirin 81 MG tablet Take 81 mg by mouth daily.   Cholecalciferol (DIALYVITE VITAMIN D 5000 PO)  Take by mouth.   colchicine 0.6 MG tablet 2 tablets PO at gout onswet, may repeat 1x in 1 hour. No more then 3 per day.   fenofibrate (TRICOR) 145 MG tablet Take 1 tablet (145 mg total) by mouth daily.   Ferrous Sulfate 50 MG TBCR Take by mouth.   fluticasone (FLONASE) 50 MCG/ACT nasal spray Place 2 sprays into both nostrils every morning.    metoprolol succinate (TOPROL-XL) 100 MG 24 hr tablet TAKE 1 TABLET BY MOUTH  DAILY WITH OR IMMEDIATELY  FOLLOWING A MEAL   Multiple Vitamin (MULTIVITAMIN) capsule Take 1 capsule by mouth every morning.    simvastatin (ZOCOR) 20 MG tablet Take 1 tablet (20 mg total) by mouth daily. (NEEDS TO BE SEEN BEFORE NEXT REFILL)   spironolactone (ALDACTONE) 25 MG tablet Take 1 tablet (25 mg total) by mouth daily.   valsartan-hydrochlorothiazide (DIOVAN-HCT) 320-25 MG tablet TAKE 1 TABLET BY MOUTH  DAILY   No facility-administered encounter medications on file as of 09/06/2020.    Past Surgical History:  Procedure Laterality Date   RETINAL DETACHMENT SURGERY      Family History  Problem Relation Age of Onset   CAD Mother 35   CAD Father 54       CABG, died age 46   Colon cancer Neg Hx  Esophageal cancer Neg Hx    Stomach cancer Neg Hx    Rectal cancer Neg Hx     New complaints: None today  Social history: Lives by hisself  Controlled substance contract: n/a      Review of Systems  Constitutional:  Negative for diaphoresis.  Eyes:  Negative for pain.  Respiratory:  Negative for shortness of breath.   Cardiovascular:  Negative for chest pain, palpitations and leg swelling.  Gastrointestinal:  Negative for abdominal pain.  Endocrine: Negative for polydipsia.  Skin:  Negative for rash.  Neurological:  Negative for dizziness, weakness and headaches.  Hematological:  Does not bruise/bleed easily.  All other systems reviewed and are negative.     Objective:   Physical Exam Vitals and nursing note reviewed.  Constitutional:       Appearance: Normal appearance. He is well-developed.  HENT:     Head: Normocephalic.     Nose: Nose normal.  Eyes:     Pupils: Pupils are equal, round, and reactive to light.  Neck:     Thyroid: No thyroid mass or thyromegaly.     Vascular: No carotid bruit or JVD.     Trachea: Phonation normal.  Cardiovascular:     Rate and Rhythm: Normal rate and regular rhythm.  Pulmonary:     Effort: Pulmonary effort is normal. No respiratory distress.     Breath sounds: Normal breath sounds.  Abdominal:     General: Bowel sounds are normal.     Palpations: Abdomen is soft.     Tenderness: There is no abdominal tenderness.  Musculoskeletal:        General: Normal range of motion.     Cervical back: Normal range of motion and neck supple.  Lymphadenopathy:     Cervical: No cervical adenopathy.  Skin:    General: Skin is warm and dry.  Neurological:     Mental Status: He is alert and oriented to person, place, and time.  Psychiatric:        Behavior: Behavior normal.        Thought Content: Thought content normal.        Judgment: Judgment normal.    BP (!) 135/94   Pulse 72   Temp 98.3 F (36.8 C) (Temporal)   Resp 20   Ht 6' 1"  (1.854 m)   Wt (!) 338 lb (153.3 kg)   SpO2 96%   BMI 44.59 kg/m        Assessment & Plan:   Danielle Lento comes in today with chief complaint of Medical Management of Chronic Issues   Diagnosis and orders addressed:  1. Essential hypertension Low sodium diet - CBC with Differential/Platelet - CMP14+EGFR - amLODipine (NORVASC) 10 MG tablet; Take 1 tablet (10 mg total) by mouth daily. (NEEDS TO BE SEEN BEFORE NEXT REFILL)  Dispense: 90 tablet; Refill: 0 - valsartan-hydrochlorothiazide (DIOVAN-HCT) 320-25 MG tablet; Take 1 tablet by mouth daily.  Dispense: 90 tablet; Refill: 0 - metoprolol succinate (TOPROL-XL) 100 MG 24 hr tablet; TAKE 1 TABLET BY MOUTH  DAILY WITH OR IMMEDIATELY  FOLLOWING A MEAL  Dispense: 90 tablet; Refill: 1 - spironolactone  (ALDACTONE) 25 MG tablet; Take 1 tablet (25 mg total) by mouth daily.  Dispense: 90 tablet; Refill: 1  2. Hyperlipidemia with target LDL less than 100 Low fat diet - Lipid panel - simvastatin (ZOCOR) 20 MG tablet; Take 1 tablet (20 mg total) by mouth daily. (NEEDS TO BE SEEN BEFORE NEXT REFILL)  Dispense: 90  tablet; Refill: 0 - fenofibrate (TRICOR) 145 MG tablet; Take 1 tablet (145 mg total) by mouth daily.  Dispense: 90 tablet; Refill: 1  3. Metabolic syndrome Watch carbs in diet - Bayer DCA Hb A1c Waived - Microalbumin / creatinine urine ratio  4. Chronic kidney disease (CKD) stage G3a/A1, moderately decreased glomerular filtration rate (GFR) between 45-59 mL/min/1.73 square meter and albuminuria creatinine ratio less than 30 mg/g (HCC) Labs pending  5. Morbid obesity ([place]) Discussed diet and exercise for person with BMI >25 Will recheck weight in 3-6 months Only use colchicine when necessary  6. Acute gout of right foot, unspecified cause Only take colchicine as needed Skip zocor when taking colchicine - colchicine 0.6 MG tablet; 2 tablets PO at gout onswet, may repeat 1x in 1 hour. No more then 3 per day.  Dispense: 30 tablet; Refill: 1   Labs pending Health Maintenance reviewed Diet and exercise encouraged  Follow up plan: 3 months   [name] Done, FNP

## 2020-09-07 LAB — MICROALBUMIN / CREATININE URINE RATIO
Creatinine, Urine: 113.8 mg/dL
Microalb/Creat Ratio: 347 mg/g creat — ABNORMAL HIGH (ref 0–29)
Microalbumin, Urine: 394.4 ug/mL

## 2020-10-31 ENCOUNTER — Other Ambulatory Visit: Payer: Self-pay | Admitting: Nurse Practitioner

## 2020-10-31 DIAGNOSIS — I10 Essential (primary) hypertension: Secondary | ICD-10-CM

## 2020-10-31 DIAGNOSIS — E785 Hyperlipidemia, unspecified: Secondary | ICD-10-CM

## 2020-12-07 ENCOUNTER — Encounter: Payer: Self-pay | Admitting: Nurse Practitioner

## 2020-12-07 ENCOUNTER — Ambulatory Visit: Payer: BC Managed Care – PPO | Admitting: Nurse Practitioner

## 2020-12-07 ENCOUNTER — Other Ambulatory Visit: Payer: Self-pay

## 2020-12-07 VITALS — BP 133/85 | HR 59 | Temp 98.3°F | Resp 20 | Ht 73.0 in | Wt 322.0 lb

## 2020-12-07 DIAGNOSIS — I10 Essential (primary) hypertension: Secondary | ICD-10-CM | POA: Diagnosis not present

## 2020-12-07 DIAGNOSIS — N1831 Chronic kidney disease, stage 3a: Secondary | ICD-10-CM | POA: Diagnosis not present

## 2020-12-07 DIAGNOSIS — E8881 Metabolic syndrome: Secondary | ICD-10-CM | POA: Diagnosis not present

## 2020-12-07 DIAGNOSIS — R001 Bradycardia, unspecified: Secondary | ICD-10-CM

## 2020-12-07 DIAGNOSIS — E785 Hyperlipidemia, unspecified: Secondary | ICD-10-CM

## 2020-12-07 LAB — BAYER DCA HB A1C WAIVED: HB A1C (BAYER DCA - WAIVED): 5.7 % — ABNORMAL HIGH (ref 4.8–5.6)

## 2020-12-07 MED ORDER — METOPROLOL SUCCINATE ER 100 MG PO TB24: ORAL_TABLET | ORAL | 1 refills | Status: DC

## 2020-12-07 MED ORDER — AMLODIPINE BESYLATE 10 MG PO TABS: 10.0000 mg | ORAL_TABLET | Freq: Every day | ORAL | 1 refills | Status: DC

## 2020-12-07 MED ORDER — SPIRONOLACTONE 25 MG PO TABS: 25.0000 mg | ORAL_TABLET | Freq: Every day | ORAL | 1 refills | Status: DC

## 2020-12-07 MED ORDER — ROSUVASTATIN CALCIUM 10 MG PO TABS: 10.0000 mg | ORAL_TABLET | Freq: Every day | ORAL | 1 refills | Status: DC

## 2020-12-07 MED ORDER — VALSARTAN-HYDROCHLOROTHIAZIDE 320-25 MG PO TABS: 1.0000 | ORAL_TABLET | Freq: Every day | ORAL | 1 refills | Status: DC

## 2020-12-07 MED ORDER — FENOFIBRATE 145 MG PO TABS: 145.0000 mg | ORAL_TABLET | Freq: Every day | ORAL | 1 refills | Status: DC

## 2020-12-07 NOTE — Progress Notes (Signed)
Subjective:    Patient ID: Ian Stewart, male    DOB: [birth year], 50 y.o.   MRN: [id number]  Chief Complaint: medical management of chronic issues     HPI:  1. Essential hypertension No c/o chest pain, sob or headache. Doe snot check blood pressure at home. BP Readings from Last 3 Encounters:  09/06/20 (!) 135/94  03/06/20 (!) 143/97  02/23/20 132/83     2. Hyperlipidemia with target LDL less than 100 Does not watch diet very closely and does little to no dedicated exercise. Lab Results  Component Value Date   CHOL 210 (H) 09/06/2020   HDL 25 (L) 09/06/2020   LDLCALC 109 (H) 09/06/2020   TRIG 438 (H) 09/06/2020   CHOLHDL 8.4 (H) 09/06/2020     3. Metabolic syndrome Does not check blood sugar  at home. Lab Results  Component Value Date   HGBA1C 6.2 09/06/2020      4. Chronic kidney disease (CKD) stage G3a/A1, moderately decreased glomerular filtration rate (GFR) between 45-59 mL/min/1.73 square meter and albuminuria creatinine ratio less than 30 mg/g (HCC) No problems voiding with  no edema Lab Results  Component Value Date   CREATININE 1.85 (H) 09/06/2020     5. Bradycardia He denies any near syncopial episodes, no dizziness  6. Morbid obesity ([place]) No recent weight changes Wt Readings from Last 3 Encounters:  09/06/20 (!) 338 lb (153.3 kg)  03/06/20 (!) 336 lb (152.4 kg)  02/23/20 (!) 337 lb 9.6 oz (153.1 kg)   BMI Readings from Last 3 Encounters:  09/06/20 44.59 kg/m  03/06/20 44.33 kg/m  02/23/20 44.54 kg/m       Outpatient Encounter Medications as of 12/07/2020  Medication Sig   acetaminophen (TYLENOL) 500 MG tablet Take 1,000 mg by mouth every 6 (six) hours as needed for mild pain or moderate pain.   amLODipine (NORVASC) 10 MG tablet TAKE 1 TABLET BY MOUTH  DAILY   Ascorbic Acid (VITAMIN C) 500 MG CAPS Take by mouth.   aspirin 81 MG tablet Take 81 mg by mouth daily.   Cholecalciferol (DIALYVITE VITAMIN D 5000 PO) Take by mouth.    colchicine 0.6 MG tablet 2 tablets PO at gout onswet, may repeat 1x in 1 hour. No more then 3 per day.   fenofibrate (TRICOR) 145 MG tablet Take 1 tablet (145 mg total) by mouth daily.   Ferrous Sulfate 50 MG TBCR Take by mouth.   fluticasone (FLONASE) 50 MCG/ACT nasal spray Place 2 sprays into both nostrils every morning.    metoprolol succinate (TOPROL-XL) 100 MG 24 hr tablet TAKE 1 TABLET BY MOUTH  DAILY WITH OR IMMEDIATELY  FOLLOWING A MEAL   Multiple Vitamin (MULTIVITAMIN) capsule Take 1 capsule by mouth every morning.    simvastatin (ZOCOR) 20 MG tablet TAKE 1 TABLET BY MOUTH  DAILY   spironolactone (ALDACTONE) 25 MG tablet Take 1 tablet (25 mg total) by mouth daily.   valsartan-hydrochlorothiazide (DIOVAN-HCT) 320-25 MG tablet Take 1 tablet by mouth daily.   No facility-administered encounter medications on file as of 12/07/2020.    Past Surgical History:  Procedure Laterality Date   RETINAL DETACHMENT SURGERY      Family History  Problem Relation Age of Onset   CAD Mother 75   CAD Father 84       CABG, died age 33   Colon cancer Neg Hx    Esophageal cancer Neg Hx    Stomach cancer Neg Hx    Rectal cancer  Neg Hx     New complaints: None today  Social history: Lives by hisself  Controlled substance contract: n/a     Review of Systems  Constitutional:  Negative for diaphoresis.  Eyes:  Negative for pain.  Respiratory:  Negative for shortness of breath.   Cardiovascular:  Negative for chest pain, palpitations and leg swelling.  Gastrointestinal:  Negative for abdominal pain.  Endocrine: Negative for polydipsia.  Skin:  Negative for rash.  Neurological:  Negative for dizziness, weakness and headaches.  Hematological:  Does not bruise/bleed easily.  All other systems reviewed and are negative.     Objective:   Physical Exam Vitals and nursing note reviewed.  Constitutional:      Appearance: Normal appearance. He is well-developed.  HENT:     Head:  Normocephalic.     Nose: Nose normal.  Eyes:     Pupils: Pupils are equal, round, and reactive to light.  Neck:     Thyroid: No thyroid mass or thyromegaly.     Vascular: No carotid bruit or JVD.     Trachea: Phonation normal.  Cardiovascular:     Rate and Rhythm: Normal rate and regular rhythm.  Pulmonary:     Effort: Pulmonary effort is normal. No respiratory distress.     Breath sounds: Normal breath sounds.  Abdominal:     General: Bowel sounds are normal.     Palpations: Abdomen is soft.     Tenderness: There is no abdominal tenderness.  Musculoskeletal:        General: Normal range of motion.     Cervical back: Normal range of motion and neck supple.  Lymphadenopathy:     Cervical: No cervical adenopathy.  Skin:    General: Skin is warm and dry.  Neurological:     Mental Status: He is alert and oriented to person, place, and time.  Psychiatric:        Behavior: Behavior normal.        Thought Content: Thought content normal.        Judgment: Judgment normal.    BP 133/85   Pulse (!) 59   Temp 98.3 F (36.8 C) (Temporal)   Resp 20   Ht 6\' 1"  (1.854 m)   Wt (!) 322 lb (146.1 kg)   SpO2 97%   BMI 42.48 kg/m   HGBA1c 5.7%      Assessment & Plan:  Ian Stewart comes in today with chief complaint of Medical Management of Chronic Issues   Diagnosis and orders addressed:  1. Essential hypertension Low sodium diet - amLODipine (NORVASC) 10 MG tablet; Take 1 tablet (10 mg total) by mouth daily.  Dispense: 90 tablet; Refill: 1 - spironolactone (ALDACTONE) 25 MG tablet; Take 1 tablet (25 mg total) by mouth daily.  Dispense: 90 tablet; Refill: 1 - valsartan-hydrochlorothiazide (DIOVAN-HCT) 320-25 MG tablet; Take 1 tablet by mouth daily.  Dispense: 90 tablet; Refill: 1 - metoprolol succinate (TOPROL-XL) 100 MG 24 hr tablet; TAKE 1 TABLET BY MOUTH  DAILY WITH OR IMMEDIATELY  FOLLOWING A MEAL  Dispense: 90 tablet; Refill: 1  2. Hyperlipidemia with target LDL less than  100 Low fat diet - fenofibrate (TRICOR) 145 MG tablet; Take 1 tablet (145 mg total) by mouth daily.  Dispense: 90 tablet; Refill: 1  3. Metabolic syndrome Contninue to watch carbs in diet - Bayer DCA Hb A1c Waived  4. Chronic kidney disease (CKD) stage G3a/A1, moderately decreased glomerular filtration rate (GFR) between 45-59 mL/min/1.73 square meter and  albuminuria creatinine ratio less than 30 mg/g (HCC) Labs pending  5. Bradycardia Report any dizziness   6. Morbid obesity ([place]) Discussed diet and exercise for person with BMI >25 Will recheck weight in 3-6 months    Labs pending Health Maintenance reviewed Diet and exercise encouraged  Follow up plan: 6 months   [name] Done, FNP

## 2020-12-07 NOTE — Patient Instructions (Signed)

## 2021-02-21 NOTE — Progress Notes (Signed)
Cardiology Office Note   Date:  02/22/2021   ID:  Ian Stewart, DOB 1970-03-13, MRN [id number]  PCP:  Chevis Pretty, FNP  Cardiologist:   Minus Breeding, MD   Chief Complaint  Patient presents with   Cardiomyopathy       History of Present Illness: Ian Stewart is a 51 y.o. male who was referred by Chevis Pretty, [place] for evaluation of  bradycardia.  This was noted on his Apple Watch.  He has had some chest pain and had a negative POET (Plain Old Exercise Treadmill) in 2019.  Echocardiogram in January 2021 demonstrated a mildly reduced ejection fraction 45 to 50%.  He returns for follow-up.  Since I last saw him he has done well.  He has a very active job where he does a lot of walking.  He very has a mile and this morning. The patient denies any new symptoms such as chest discomfort, neck or arm discomfort. There has been no new shortness of breath, PND or orthopnea. There have been no reported palpitations, presyncope or syncope.    Past Medical History:  Diagnosis Date   Arrhythmia    BRADYCARDIA/ 41-50 BEATS   Chronic kidney disease    monitoring kidey function due to other medications   Hyperlipidemia    Hypertension     Past Surgical History:  Procedure Laterality Date   RETINAL DETACHMENT SURGERY       Current Outpatient Medications  Medication Sig Dispense Refill   acetaminophen (TYLENOL) 500 MG tablet Take 1,000 mg by mouth every 6 (six) hours as needed for mild pain or moderate pain.     amLODipine (NORVASC) 10 MG tablet Take 1 tablet (10 mg total) by mouth daily. 90 tablet 1   Ascorbic Acid (VITAMIN C) 500 MG CAPS Take by mouth.     aspirin 81 MG tablet Take 81 mg by mouth daily.     Cholecalciferol (DIALYVITE VITAMIN D 5000 PO) Take by mouth.     colchicine 0.6 MG tablet 2 tablets PO at gout onswet, may repeat 1x in 1 hour. No more then 3 per day. 30 tablet 1   fenofibrate (TRICOR) 145 MG tablet Take 1 tablet (145 mg total) by mouth daily.  90 tablet 1   Ferrous Sulfate 50 MG TBCR Take by mouth.     fluticasone (FLONASE) 50 MCG/ACT nasal spray Place 2 sprays into both nostrils every morning.      metoprolol succinate (TOPROL-XL) 100 MG 24 hr tablet TAKE 1 TABLET BY MOUTH  DAILY WITH OR IMMEDIATELY  FOLLOWING A MEAL 90 tablet 1   Multiple Vitamin (MULTIVITAMIN) capsule Take 1 capsule by mouth every morning.      rosuvastatin (CRESTOR) 10 MG tablet Take 1 tablet (10 mg total) by mouth daily. 90 tablet 1   spironolactone (ALDACTONE) 25 MG tablet Take 1 tablet (25 mg total) by mouth daily. 90 tablet 1   valsartan-hydrochlorothiazide (DIOVAN-HCT) 320-25 MG tablet Take 1 tablet by mouth daily. 90 tablet 1   No current facility-administered medications for this visit.    Allergies:   Lipitor [atorvastatin]    ROS:  Please see the history of present illness.   Otherwise, review of systems are positive for none.   All other systems are reviewed and negative.    PHYSICAL EXAM: VS:  BP 132/88 (BP Location: Left Arm, Patient Position: Sitting, Cuff Size: Large)    Pulse 67    Ht 6\' 1"  (1.854 m)    Wt Marland Kitchen)  333 lb (151 kg)    BMI 43.93 kg/m  , BMI Body mass index is 43.93 kg/m. GENERAL:  Well appearing NECK:  No jugular venous distention, waveform within normal limits, carotid upstroke brisk and symmetric, no bruits, no thyromegaly LUNGS:  Clear to auscultation bilaterally CHEST:  Unremarkable HEART:  PMI not displaced or sustained,S1 and S2 within normal limits, no S3, no S4, no clicks, no rubs, no murmurs ABD:  Flat, positive bowel sounds normal in frequency in pitch, no bruits, no rebound, no guarding, no midline pulsatile mass, no hepatomegaly, no splenomegaly EXT:  2 plus pulses throughout, no edema, no cyanosis no clubbing   EKG:  EKG is not ordered today. Sinus rhythm, rate 67, left axis deviation, premature ventricular contractions, nonspecific T wave changes.   Recent Labs: 09/06/2020: ALT 22; BUN 34; Creatinine, Ser 1.85;  Hemoglobin 13.2; Platelets 228; Potassium 4.5; Sodium 139    Lipid Panel    Component Value Date/Time   CHOL 210 (H) 09/06/2020 0820   TRIG 438 (H) 09/06/2020 0820   TRIG 263 (H) 07/20/2014 1450   HDL 25 (L) 09/06/2020 0820   HDL 37 (L) 07/20/2014 1450   CHOLHDL 8.4 (H) 09/06/2020 0820   LDLCALC 109 (H) 09/06/2020 0820      Wt Readings from Last 3 Encounters:  02/22/21 (!) 333 lb (151 kg)  12/07/20 (!) 322 lb (146.1 kg)  09/06/20 (!) 338 lb (153.3 kg)      Other studies Reviewed: Additional studies/ records that were reviewed today include:  Labs Review of the above records demonstrates: See   ASSESSMENT AND PLAN:  HTN:   His blood pressure is controlled.  No change in therapy.   CARDIOMYOPATHY:   He has no symptoms.  He is on optimal medical therapy.  He is up-to-date with follow-up labs.  I likely will check an echo next year.  BRADYCARDIA: He has no symptoms related to this.  No change in therapy.  DYSLIPIDEMIA:  Lipids were very mildly elevated with an LDL of 109 in June.  He understands need for diet and we talked specifically about plant-based diet  OBESITY:    He understands the need for weight loss with diet and exercise.  CKD: His creatinine last was 1.85 and down from previous.    Current medicines are reviewed at length with the patient today.  The patient does not have concerns regarding medicines.  The following changes have been made: None  Labs/ tests ordered today include: None  Orders Placed This Encounter  Procedures   EKG 12-Lead      Disposition:   FU with 1 year   Signed, Minus Breeding, MD  02/22/2021 8:42 AM    [place]

## 2021-02-22 ENCOUNTER — Ambulatory Visit: Payer: BC Managed Care – PPO | Admitting: Cardiology

## 2021-02-22 ENCOUNTER — Encounter: Payer: Self-pay | Admitting: Cardiology

## 2021-02-22 ENCOUNTER — Other Ambulatory Visit: Payer: Self-pay

## 2021-02-22 VITALS — BP 132/88 | HR 67 | Ht 73.0 in | Wt 333.0 lb

## 2021-02-22 DIAGNOSIS — R001 Bradycardia, unspecified: Secondary | ICD-10-CM

## 2021-02-22 DIAGNOSIS — E785 Hyperlipidemia, unspecified: Secondary | ICD-10-CM

## 2021-02-22 DIAGNOSIS — N1832 Chronic kidney disease, stage 3b: Secondary | ICD-10-CM | POA: Diagnosis not present

## 2021-02-22 DIAGNOSIS — I420 Dilated cardiomyopathy: Secondary | ICD-10-CM | POA: Diagnosis not present

## 2021-02-22 NOTE — Patient Instructions (Signed)
Medication Instructions:  Your physician recommends that you continue on your current medications as directed. Please refer to the Current Medication list given to you today.  *If you need a refill on your cardiac medications before your next appointment, please call your pharmacy*   Lab Work: None If you have labs (blood work) drawn today and your tests are completely normal, you will receive your results only by: [place] (if you have MyChart) OR A paper copy in the mail If you have any lab test that is abnormal or we need to change your treatment, we will call you to review the results.   Testing/Procedures: None   Follow-Up: At [facility], you and your health needs are our priority.  As part of our continuing mission to provide you with exceptional heart care, we have created designated Provider Care Teams.  These Care Teams include your primary Cardiologist (physician) and Advanced Practice Providers (APPs -  Physician Assistants and Nurse Practitioners) who all work together to provide you with the care you need, when you need it.  We recommend signing up for the patient portal called "MyChart".  Sign up information is provided on this After Visit Summary.  MyChart is used to connect with patients for Virtual Visits (Telemedicine).  Patients are able to view lab/test results, encounter notes, upcoming appointments, etc.  Non-urgent messages can be sent to your provider as well.   To learn more about what you can do with MyChart, go to [email or web address].    Your next appointment:   12 month(s)  The format for your next appointment:   In Person  Provider:   Minus Breeding, MD    Other Instructions

## 2021-03-05 ENCOUNTER — Other Ambulatory Visit: Payer: Self-pay | Admitting: Nurse Practitioner

## 2021-04-11 ENCOUNTER — Encounter: Payer: Self-pay | Admitting: Family Medicine

## 2021-04-11 ENCOUNTER — Ambulatory Visit: Payer: BC Managed Care – PPO | Admitting: Family Medicine

## 2021-04-11 DIAGNOSIS — U071 COVID-19: Secondary | ICD-10-CM | POA: Diagnosis not present

## 2021-04-11 MED ORDER — MOLNUPIRAVIR EUA 200MG CAPSULE: 4.0000 | ORAL_CAPSULE | Freq: Two times a day (BID) | ORAL | 0 refills | Status: AC

## 2021-04-11 NOTE — Progress Notes (Signed)
Virtual Visit via telephone Note Due to COVID-19 pandemic this visit was conducted virtually. This visit type was conducted due to national recommendations for restrictions regarding the COVID-19 Pandemic (e.g. social distancing, sheltering in place) in an effort to limit this patient's exposure and mitigate transmission in our community. All issues noted in this document were discussed and addressed.  A physical exam was not performed with this format.   I connected with Ian Stewart on 04/11/2021 at [place] by telephone and verified that I am speaking with the correct person using two identifiers. Ian Stewart is currently located at home and family is currently with them during visit. The provider, Monia Pouch, FNP is located in their office at time of visit.  I discussed the limitations, risks, security and privacy concerns of performing an evaluation and management service by virtual visit and the availability of in person appointments. I also discussed with the patient that there may be a patient responsible charge related to this service. The patient expressed understanding and agreed to proceed.  Subjective:  Patient ID: Ian Stewart, male    DOB: 04-18-1970, 51 y.o.   MRN: [id number]  Chief Complaint:  Covid Positive   HPI: Ian Stewart is a 51 y.o. male presenting on 04/11/2021 for Covid Positive   Pt reports he developed a cough and rhinorrhea with low grade fever yesterday. States he took some Coricidin HBP which did not help symptoms so he took a COVID test this morning and it was positive.   URI  This is a new problem. The current episode started yesterday. The maximum temperature recorded prior to his arrival was 100.4 - 100.9 F. The fever has been present for 1 to 2 days. Associated symptoms include congestion, coughing, rhinorrhea and a sore throat. Pertinent negatives include no abdominal pain, chest pain, diarrhea, dysuria, ear pain, headaches, joint pain, joint swelling, nausea,  neck pain, plugged ear sensation, rash, sinus pain, sneezing, swollen glands, vomiting or wheezing. He has tried decongestant for the symptoms. The treatment provided no relief.    Relevant past medical, surgical, family, and social history reviewed and updated as indicated.  Allergies and medications reviewed and updated.   Past Medical History:  Diagnosis Date   Arrhythmia    BRADYCARDIA/ 41-50 BEATS   Chronic kidney disease    monitoring kidey function due to other medications   Hyperlipidemia    Hypertension     Past Surgical History:  Procedure Laterality Date   RETINAL DETACHMENT SURGERY      Social History   Socioeconomic History   Marital status: Single    Spouse name: Not on file   Number of children: Not on file   Years of education: Not on file   Highest education level: Not on file  Occupational History   Not on file  Tobacco Use   Smoking status: Never   Smokeless tobacco: Never  Vaping Use   Vaping Use: Never used  Substance and Sexual Activity   Alcohol use: No   Drug use: No   Sexual activity: Not on file  Other Topics Concern   Not on file  Social History Narrative   Works at [employer] and lives alone.     Social Determinants of Health   Financial Resource Strain: Not on file  Food Insecurity: Not on file  Transportation Needs: Not on file  Physical Activity: Not on file  Stress: Not on file  Social Connections: Not on file  Intimate Partner Violence: Not on file  Outpatient Encounter Medications as of 04/11/2021  Medication Sig   molnupiravir EUA (LAGEVRIO) 200 mg CAPS capsule Take 4 capsules (800 mg total) by mouth 2 (two) times daily for 5 days.   acetaminophen (TYLENOL) 500 MG tablet Take 1,000 mg by mouth every 6 (six) hours as needed for mild pain or moderate pain.   amLODipine (NORVASC) 10 MG tablet Take 1 tablet (10 mg total) by mouth daily.   Ascorbic Acid (VITAMIN C) 500 MG CAPS Take by mouth.   aspirin 81 MG tablet Take 81 mg by  mouth daily.   Cholecalciferol (DIALYVITE VITAMIN D 5000 PO) Take by mouth.   colchicine 0.6 MG tablet 2 tablets PO at gout onswet, may repeat 1x in 1 hour. No more then 3 per day.   fenofibrate (TRICOR) 145 MG tablet Take 1 tablet (145 mg total) by mouth daily.   Ferrous Sulfate 50 MG TBCR Take by mouth.   fluticasone (FLONASE) 50 MCG/ACT nasal spray Place 2 sprays into both nostrils every morning.    metoprolol succinate (TOPROL-XL) 100 MG 24 hr tablet TAKE 1 TABLET BY MOUTH  DAILY WITH OR IMMEDIATELY  FOLLOWING A MEAL   Multiple Vitamin (MULTIVITAMIN) capsule Take 1 capsule by mouth every morning.    rosuvastatin (CRESTOR) 10 MG tablet Take 1 tablet (10 mg total) by mouth daily.   spironolactone (ALDACTONE) 25 MG tablet Take 1 tablet (25 mg total) by mouth daily.   valsartan-hydrochlorothiazide (DIOVAN-HCT) 320-25 MG tablet Take 1 tablet by mouth daily.   No facility-administered encounter medications on file as of 04/11/2021.    Allergies  Allergen Reactions   Lipitor [Atorvastatin] Other (See Comments)    Muscle aches    Review of Systems  Constitutional:  Positive for activity change, chills, fatigue and fever. Negative for appetite change, diaphoresis and unexpected weight change.  HENT:  Positive for congestion, postnasal drip, rhinorrhea and sore throat. Negative for ear pain, sinus pressure, sinus pain and sneezing.   Respiratory:  Positive for cough. Negative for shortness of breath and wheezing.   Cardiovascular:  Negative for chest pain, palpitations and leg swelling.  Gastrointestinal:  Negative for abdominal pain, diarrhea, nausea and vomiting.  Genitourinary:  Negative for decreased urine volume, difficulty urinating and dysuria.  Musculoskeletal:  Positive for myalgias. Negative for arthralgias, joint pain and neck pain.  Skin:  Negative for rash.  Neurological:  Negative for dizziness, weakness, light-headedness and headaches.  Psychiatric/Behavioral:  Negative for  confusion.   All other systems reviewed and are negative.       Observations/Objective: No vital signs or physical exam, this was a virtual health encounter.  Pt alert and oriented, answers all questions appropriately, and able to speak in full sentences.    Assessment and Plan: Braelen was seen today for covid positive.  Diagnoses and all orders for this visit:  Positive self-administered antigen test for COVID-19 Positive for COVID at home, onset of symptoms yesterday. Multiple comorbidities, will initiate antiviral therapy. Symptomatic care at home discussed in detail. Continue Coricidin HBP and add tylenol as needed for fever and pain control, plain mucinex with plenty of fluids. Report any new, worsening, or persistent symptoms.  -     molnupiravir EUA (LAGEVRIO) 200 mg CAPS capsule; Take 4 capsules (800 mg total) by mouth 2 (two) times daily for 5 days.     Follow Up Instructions: Return if symptoms worsen or fail to improve.    I discussed the assessment and treatment plan with the patient. The  patient was provided an opportunity to ask questions and all were answered. The patient agreed with the plan and demonstrated an understanding of the instructions.   The patient was advised to call back or seek an in-person evaluation if the symptoms worsen or if the condition fails to improve as anticipated.  The above assessment and management plan was discussed with the patient. The patient verbalized understanding of and has agreed to the management plan. Patient is aware to call the clinic if they develop any new symptoms or if symptoms persist or worsen. Patient is aware when to return to the clinic for a follow-up visit. Patient educated on when it is appropriate to go to the emergency department.    I provided 15 minutes of time during this telephone encounter.   Monia Pouch, FNP-C [facility] [address] [place], [place] [address] [phone] 04/11/2021

## 2021-04-26 ENCOUNTER — Other Ambulatory Visit: Payer: Self-pay | Admitting: Nurse Practitioner

## 2021-05-21 ENCOUNTER — Other Ambulatory Visit: Payer: Self-pay | Admitting: Nurse Practitioner

## 2021-05-21 DIAGNOSIS — I10 Essential (primary) hypertension: Secondary | ICD-10-CM

## 2021-06-01 ENCOUNTER — Other Ambulatory Visit: Payer: Self-pay | Admitting: Nurse Practitioner

## 2021-06-01 DIAGNOSIS — E785 Hyperlipidemia, unspecified: Secondary | ICD-10-CM

## 2021-06-01 DIAGNOSIS — I10 Essential (primary) hypertension: Secondary | ICD-10-CM

## 2021-06-07 ENCOUNTER — Ambulatory Visit: Payer: BC Managed Care – PPO | Admitting: Nurse Practitioner

## 2021-06-07 ENCOUNTER — Encounter: Payer: Self-pay | Admitting: Nurse Practitioner

## 2021-06-07 VITALS — BP 131/89 | HR 69 | Temp 98.1°F | Resp 20 | Ht 73.0 in | Wt 329.0 lb

## 2021-06-07 DIAGNOSIS — E785 Hyperlipidemia, unspecified: Secondary | ICD-10-CM | POA: Diagnosis not present

## 2021-06-07 DIAGNOSIS — N1831 Chronic kidney disease, stage 3a: Secondary | ICD-10-CM

## 2021-06-07 DIAGNOSIS — Z125 Encounter for screening for malignant neoplasm of prostate: Secondary | ICD-10-CM

## 2021-06-07 DIAGNOSIS — E8881 Metabolic syndrome: Secondary | ICD-10-CM

## 2021-06-07 DIAGNOSIS — I10 Essential (primary) hypertension: Secondary | ICD-10-CM

## 2021-06-07 DIAGNOSIS — R001 Bradycardia, unspecified: Secondary | ICD-10-CM

## 2021-06-07 LAB — BAYER DCA HB A1C WAIVED: HB A1C (BAYER DCA - WAIVED): 6.7 % — ABNORMAL HIGH (ref 4.8–5.6)

## 2021-06-07 MED ORDER — AMLODIPINE BESYLATE 10 MG PO TABS: 10.0000 mg | ORAL_TABLET | Freq: Every day | ORAL | 1 refills | Status: DC

## 2021-06-07 MED ORDER — VALSARTAN-HYDROCHLOROTHIAZIDE 320-25 MG PO TABS: 1.0000 | ORAL_TABLET | Freq: Every day | ORAL | 1 refills | Status: DC

## 2021-06-07 MED ORDER — METOPROLOL SUCCINATE ER 100 MG PO TB24: ORAL_TABLET | ORAL | 1 refills | Status: DC

## 2021-06-07 MED ORDER — SPIRONOLACTONE 25 MG PO TABS: 25.0000 mg | ORAL_TABLET | Freq: Every day | ORAL | 1 refills | Status: DC

## 2021-06-07 MED ORDER — ROSUVASTATIN CALCIUM 10 MG PO TABS: 10.0000 mg | ORAL_TABLET | Freq: Every day | ORAL | 1 refills | Status: DC

## 2021-06-07 NOTE — Patient Instructions (Signed)

## 2021-06-07 NOTE — Progress Notes (Signed)
? ?Subjective:  ? ? Patient ID: Ian Stewart, male    DOB: [birth year], 51 y.o.   MRN: [id number] ? ? ?Chief Complaint: medical management of chronic issues  ?  ? ?HPI: ? ?Ian Stewart is a 51 y.o. who identifies as a male who was assigned male at birth.  ? ?Social history: ?Lives with: him and his brother live together ?Work history: [employer] ? ? ?Comes in today for follow up of the following chronic medical issues: ? ?1. Essential hypertension ?No c/o chest pain, sob or headache. Does not check blood pressure at home. ?BP Readings from Last 3 Encounters:  ?02/22/21 132/88  ?12/07/20 133/85  ?09/06/20 (!) 135/94  ? ? ? ?2. Hyperlipidemia with target LDL less than 100 ?Does not really watch diet and does little to no exercise ?Lab Results  ?Component Value Date  ? CHOL 210 (H) 09/06/2020  ? HDL 25 (L) 09/06/2020  ? LDLCALC 109 (H) 09/06/2020  ? TRIG 438 (H) 09/06/2020  ? CHOLHDL 8.4 (H) 09/06/2020  ?The 10-year ASCVD risk score (Arnett DK, et al., 2019) is: 36.7% ? ? ? ?3. Chronic kidney disease (CKD) stage G3a/A1, moderately decreased glomerular filtration rate (GFR) between 45-59 mL/min/1.73 square meter and albuminuria creatinine ratio less than 30 mg/g (HCC) ?No voiding issues ?Lab Results  ?Component Value Date  ? CREATININE 1.85 (H) 09/06/2020  ? ? ? ?4. Metabolic syndrome ?He does not check blood sugars at home. ?Lab Results  ?Component Value Date  ? HGBA1C 5.7 (H) 12/07/2020  ? ? ? ?5. Bradycardia ?Pulse Readings from Last 3 Encounters:  ?02/22/21 67  ?12/07/20 (!) 59  ?09/06/20 72  ? ? ? ?6. Morbid obesity ([place]) ?No recent weight changes ?Wt Readings from Last 3 Encounters:  ?02/22/21 (!) 333 lb (151 kg)  ?12/07/20 (!) 322 lb (146.1 kg)  ?09/06/20 (!) 338 lb (153.3 kg)  ? ?BMI Readings from Last 3 Encounters:  ?02/22/21 43.93 kg/m?  ?12/07/20 42.48 kg/m?  ?09/06/20 44.59 kg/m?  ? ? ? ? ?New complaints: ?None today ? ?Allergies  ?Allergen Reactions  ? Lipitor [Atorvastatin] Other (See Comments)  ?  Muscle aches   ? ?Outpatient Encounter Medications as of 06/07/2021  ?Medication Sig  ? acetaminophen (TYLENOL) 500 MG tablet Take 1,000 mg by mouth every 6 (six) hours as needed for mild pain or moderate pain.  ? amLODipine (NORVASC) 10 MG tablet Take 1 tablet (10 mg total) by mouth daily.  ? Ascorbic Acid (VITAMIN C) 500 MG CAPS Take by mouth.  ? aspirin 81 MG tablet Take 81 mg by mouth daily.  ? Cholecalciferol (DIALYVITE VITAMIN D 5000 PO) Take by mouth.  ? colchicine 0.6 MG tablet 2 tablets PO at gout onswet, may repeat 1x in 1 hour. No more then 3 per day.  ? fenofibrate (TRICOR) 145 MG tablet TAKE 1 TABLET BY MOUTH  DAILY  ? Ferrous Sulfate 50 MG TBCR Take by mouth.  ? fluticasone (FLONASE) 50 MCG/ACT nasal spray Place 2 sprays into both nostrils every morning.   ? metoprolol succinate (TOPROL-XL) 100 MG 24 hr tablet TAKE 1 TABLET BY MOUTH  DAILY WITH OR IMMEDIATELY  FOLLOWING A MEAL  ? Multiple Vitamin (MULTIVITAMIN) capsule Take 1 capsule by mouth every morning.   ? rosuvastatin (CRESTOR) 10 MG tablet TAKE 1 TABLET BY MOUTH  DAILY  ? spironolactone (ALDACTONE) 25 MG tablet TAKE 1 TABLET BY MOUTH  DAILY  ? valsartan-hydrochlorothiazide (DIOVAN-HCT) 320-25 MG tablet Take 1 tablet by mouth daily.  ? ?  No facility-administered encounter medications on file as of 06/07/2021.  ? ? ?Past Surgical History:  ?Procedure Laterality Date  ? RETINAL DETACHMENT SURGERY    ? ? ?Family History  ?Problem Relation Age of Onset  ? CAD Mother 54  ? CAD Father 5  ?     CABG, died age 17  ? Colon cancer Neg Hx   ? Esophageal cancer Neg Hx   ? Stomach cancer Neg Hx   ? Rectal cancer Neg Hx   ? ? ? ? ?Controlled substance contract: n/a ? ? ? ? ?Review of Systems  ?Constitutional:  Negative for diaphoresis.  ?Eyes:  Negative for pain.  ?Respiratory:  Negative for shortness of breath.   ?Cardiovascular:  Negative for chest pain, palpitations and leg swelling.  ?Gastrointestinal:  Negative for abdominal pain.  ?Endocrine: Negative for polydipsia.   ?Skin:  Negative for rash.  ?Neurological:  Negative for dizziness, weakness and headaches.  ?Hematological:  Does not bruise/bleed easily.  ?All other systems reviewed and are negative. ? ?   ?Objective:  ? Physical Exam ?Vitals and nursing note reviewed.  ?Constitutional:   ?   Appearance: Normal appearance. He is well-developed.  ?HENT:  ?   Head: Normocephalic.  ?   Nose: Nose normal.  ?   Mouth/Throat:  ?   Mouth: Mucous membranes are moist.  ?   Pharynx: Oropharynx is clear.  ?Eyes:  ?   Pupils: Pupils are equal, round, and reactive to light.  ?Neck:  ?   Thyroid: No thyroid mass or thyromegaly.  ?   Vascular: No carotid bruit or JVD.  ?   Trachea: Phonation normal.  ?Cardiovascular:  ?   Rate and Rhythm: Normal rate and regular rhythm.  ?Pulmonary:  ?   Effort: Pulmonary effort is normal. No respiratory distress.  ?   Breath sounds: Normal breath sounds.  ?Abdominal:  ?   General: Bowel sounds are normal.  ?   Palpations: Abdomen is soft.  ?   Tenderness: There is no abdominal tenderness.  ?Musculoskeletal:     ?   General: Normal range of motion.  ?   Cervical back: Normal range of motion and neck supple.  ?Lymphadenopathy:  ?   Cervical: No cervical adenopathy.  ?Skin: ?   General: Skin is warm and dry.  ?Neurological:  ?   Mental Status: He is alert and oriented to person, place, and time.  ?Psychiatric:     ?   Behavior: Behavior normal.     ?   Thought Content: Thought content normal.     ?   Judgment: Judgment normal.  ? ? ?BP 131/89   Pulse 69   Temp 98.1 ?F (36.7 ?C) (Temporal)   Resp 20   Ht 6' 1"  (1.854 m)   Wt (!) 329 lb (149.2 kg)   SpO2 97%   BMI 43.41 kg/m?  ? ?Hgba1c 6.7 ? ?   ?Assessment & Plan:  ? ?[name] comes in today with chief complaint of Medical Management of Chronic Issues ? ? ?Diagnosis and orders addressed: ? ?1. Essential hypertension ?Low sodium diet ?- CBC with Differential/Platelet ?- CMP14+EGFR ?- metoprolol succinate (TOPROL-XL) 100 MG 24 hr tablet; Take with or  immediately following a meal.  Dispense: 90 tablet; Refill: 1 ?- spironolactone (ALDACTONE) 25 MG tablet; Take 1 tablet (25 mg total) by mouth daily.  Dispense: 90 tablet; Refill: 1 ?- amLODipine (NORVASC) 10 MG tablet; Take 1 tablet (10 mg total) by mouth daily.  Dispense: 90 tablet; Refill: 1 ?- valsartan-hydrochlorothiazide (DIOVAN-HCT) 320-25 MG tablet; Take 1 tablet by mouth daily.  Dispense: 90 tablet; Refill: 1 ? ?2. Hyperlipidemia with target LDL less than 100 ?Low fat diet ?- Lipid panel ? ?3. Chronic kidney disease (CKD) stage G3a/A1, moderately decreased glomerular filtration rate (GFR) between 45-59 mL/min/1.73 square meter and albuminuria creatinine ratio less than 30 mg/g (HCC) ?Labs pending ? ?4. Metabolic syndrome ?Continue to watch carbs in det ?- Bayer DCA Hb A1c Waived ? ?5. Bradycardia ? ?6. Morbid obesity ([place]) ?Discussed diet and exercise for person with BMI >25 ?Will recheck weight in 3-6 months ? ? ?7. Screening for prostate cancer ?Labs pending ?- PSA, total and free ? ? ?Labs pending ?Health Maintenance reviewed ?Diet and exercise encouraged ? ?Follow up plan: ?3 months ? ? ?[name] Done, FNP ? ? ? ? ? ?

## 2021-06-08 LAB — CMP14+EGFR
ALT: 24 IU/L (ref 0–44)
AST: 23 IU/L (ref 0–40)
Albumin/Globulin Ratio: 1.8 (ref 1.2–2.2)
Albumin: 4.4 g/dL (ref 3.8–4.9)
Alkaline Phosphatase: 34 IU/L — ABNORMAL LOW (ref 44–121)
BUN/Creatinine Ratio: 19 (ref 9–20)
BUN: 36 mg/dL — ABNORMAL HIGH (ref 6–24)
Bilirubin Total: 0.3 mg/dL (ref 0.0–1.2)
CO2: 22 mmol/L (ref 20–29)
Calcium: 9.9 mg/dL (ref 8.7–10.2)
Chloride: 102 mmol/L (ref 96–106)
Creatinine, Ser: 1.87 mg/dL — ABNORMAL HIGH (ref 0.76–1.27)
Globulin, Total: 2.5 g/dL (ref 1.5–4.5)
Glucose: 122 mg/dL — ABNORMAL HIGH (ref 70–99)
Potassium: 4.7 mmol/L (ref 3.5–5.2)
Sodium: 137 mmol/L (ref 134–144)
Total Protein: 6.9 g/dL (ref 6.0–8.5)
eGFR: 43 mL/min/{1.73_m2} — ABNORMAL LOW (ref >59)

## 2021-06-08 LAB — CBC WITH DIFFERENTIAL/PLATELET
Basophils Absolute: 0 10*3/uL (ref 0.0–0.2)
Basos: 1 %
EOS (ABSOLUTE): 0.1 10*3/uL (ref 0.0–0.4)
Eos: 2 %
Hematocrit: 37.9 % (ref 37.5–51.0)
Hemoglobin: 12.9 g/dL — ABNORMAL LOW (ref 13.0–17.7)
Immature Grans (Abs): 0 10*3/uL (ref 0.0–0.1)
Immature Granulocytes: 0 %
Lymphocytes Absolute: 1.8 10*3/uL (ref 0.7–3.1)
Lymphs: 34 %
MCH: 30.1 pg (ref 26.6–33.0)
MCHC: 34 g/dL (ref 31.5–35.7)
MCV: 89 fL (ref 79–97)
Monocytes Absolute: 0.5 10*3/uL (ref 0.1–0.9)
Monocytes: 10 %
Neutrophils Absolute: 2.8 10*3/uL (ref 1.4–7.0)
Neutrophils: 53 %
Platelets: 260 10*3/uL (ref 150–450)
RBC: 4.28 x10E6/uL (ref 4.14–5.80)
RDW: 14 % (ref 11.6–15.4)
WBC: 5.3 10*3/uL (ref 3.4–10.8)

## 2021-06-08 LAB — LIPID PANEL
Chol/HDL Ratio: 9.7 ratio — ABNORMAL HIGH (ref 0.0–5.0)
Cholesterol, Total: 203 mg/dL — ABNORMAL HIGH (ref 100–199)
HDL: 21 mg/dL — ABNORMAL LOW (ref >39)
LDL Chol Calc (NIH): 100 mg/dL — ABNORMAL HIGH (ref 0–99)
Triglycerides: 481 mg/dL — ABNORMAL HIGH (ref 0–149)
VLDL Cholesterol Cal: 82 mg/dL — ABNORMAL HIGH (ref 5–40)

## 2021-06-08 LAB — PSA, TOTAL AND FREE
PSA, Free Pct: 80 %
PSA, Free: 0.24 ng/mL
Prostate Specific Ag, Serum: 0.3 ng/mL (ref 0.0–4.0)

## 2021-06-27 ENCOUNTER — Telehealth: Payer: Self-pay | Admitting: *Deleted

## 2021-06-27 DIAGNOSIS — I10 Essential (primary) hypertension: Secondary | ICD-10-CM

## 2021-06-27 MED ORDER — METOPROLOL SUCCINATE ER 100 MG PO TB24: ORAL_TABLET | ORAL | 1 refills | Status: DC

## 2021-06-27 NOTE — Telephone Encounter (Signed)
Fax from [facility] ?RE: Metoprolol Succ ER Tb 100 mg ?Script came across with only Take with or immediately following a meal ?Please clarify directions and send new script ?

## 2021-06-28 NOTE — Addendum Note (Signed)
Addended by: Antonietta Barcelona D on: 06/28/2021 01:10 PM ? ? Modules accepted: Orders ? ?

## 2021-06-28 NOTE — Telephone Encounter (Signed)
Fax from [facility] ?RE: Metoprolol Succ ER tb 100 mg ?Please clarify how many tablets pt should be taking daily and send new script ?

## 2021-07-01 MED ORDER — METOPROLOL SUCCINATE ER 100 MG PO TB24: 100.0000 mg | ORAL_TABLET | Freq: Every day | ORAL | 1 refills | Status: DC

## 2021-07-01 NOTE — Addendum Note (Signed)
Addended by: Chevis Pretty on: 07/01/2021 02:08 PM ? ? Modules accepted: Orders ? ?

## 2021-08-25 ENCOUNTER — Other Ambulatory Visit: Payer: Self-pay | Admitting: Nurse Practitioner

## 2021-08-25 DIAGNOSIS — E785 Hyperlipidemia, unspecified: Secondary | ICD-10-CM

## 2021-09-12 ENCOUNTER — Ambulatory Visit: Payer: BC Managed Care – PPO | Admitting: Nurse Practitioner

## 2021-09-12 ENCOUNTER — Encounter: Payer: Self-pay | Admitting: Nurse Practitioner

## 2021-09-12 VITALS — BP 122/78 | HR 65 | Temp 97.8°F | Resp 20 | Ht 73.0 in | Wt 335.0 lb

## 2021-09-12 DIAGNOSIS — E8881 Metabolic syndrome: Secondary | ICD-10-CM | POA: Diagnosis not present

## 2021-09-12 DIAGNOSIS — Z1159 Encounter for screening for other viral diseases: Secondary | ICD-10-CM | POA: Diagnosis not present

## 2021-09-12 DIAGNOSIS — E785 Hyperlipidemia, unspecified: Secondary | ICD-10-CM

## 2021-09-12 DIAGNOSIS — R001 Bradycardia, unspecified: Secondary | ICD-10-CM

## 2021-09-12 DIAGNOSIS — N1831 Chronic kidney disease, stage 3a: Secondary | ICD-10-CM

## 2021-09-12 DIAGNOSIS — I10 Essential (primary) hypertension: Secondary | ICD-10-CM | POA: Diagnosis not present

## 2021-09-12 LAB — BAYER DCA HB A1C WAIVED: HB A1C (BAYER DCA - WAIVED): 6.5 % — ABNORMAL HIGH (ref 4.8–5.6)

## 2021-09-12 MED ORDER — VALSARTAN-HYDROCHLOROTHIAZIDE 320-25 MG PO TABS: 1.0000 | ORAL_TABLET | Freq: Every day | ORAL | 1 refills | Status: DC

## 2021-09-12 MED ORDER — AMLODIPINE BESYLATE 10 MG PO TABS: 10.0000 mg | ORAL_TABLET | Freq: Every day | ORAL | 1 refills | Status: DC

## 2021-09-12 MED ORDER — FENOFIBRATE 145 MG PO TABS: 145.0000 mg | ORAL_TABLET | Freq: Every day | ORAL | 1 refills | Status: DC

## 2021-09-12 MED ORDER — METOPROLOL SUCCINATE ER 100 MG PO TB24: 100.0000 mg | ORAL_TABLET | Freq: Every day | ORAL | 1 refills | Status: DC

## 2021-09-12 MED ORDER — ROSUVASTATIN CALCIUM 10 MG PO TABS: 10.0000 mg | ORAL_TABLET | Freq: Every day | ORAL | 1 refills | Status: DC

## 2021-09-12 MED ORDER — SPIRONOLACTONE 25 MG PO TABS: 25.0000 mg | ORAL_TABLET | Freq: Every day | ORAL | 1 refills | Status: DC

## 2021-09-12 NOTE — Patient Instructions (Signed)

## 2021-09-12 NOTE — Progress Notes (Signed)
Subjective:    Patient ID: Ian Stewart, male    DOB: Apr 14, 1970, 51 y.o.   MRN: [id number]   Chief Complaint: medical management of chronic issues     HPI:  Ian Stewart is a 51 y.o. who identifies as a male who was assigned male at birth.   Social history: Lives with: by himself Work history: [employer]   Comes in today for follow up of the following chronic medical issues:  1. Essential hypertension No c/o chest pain, sob or headache. Does not check blood pressure at home. BP Readings from Last 3 Encounters:  06/07/21 131/89  02/22/21 132/88  12/07/20 133/85     2. Hyperlipidemia with target LDL less than 100 Does try to watch diet , but oes no dedicated exercise. Lab Results  Component Value Date   CHOL 203 (H) 06/07/2021   HDL 21 (L) 06/07/2021   LDLCALC 100 (H) 06/07/2021   TRIG 481 (H) 06/07/2021   CHOLHDL 9.7 (H) 06/07/2021     3. Metabolic syndrome Does not check blood sugars at home. Lab Results  Component Value Date   HGBA1C 6.7 (H) 06/07/2021     4. Encounter for hepatitis C screening test for low risk patient Will do blood work today  5. Chronic kidney disease (CKD) stage G3a/A1, moderately decreased glomerular filtration rate (GFR) between 45-59 mL/min/1.73 square meter and albuminuria creatinine ratio less than 30 mg/g (HCC) No voiding issues. No edema. Lab Results  Component Value Date   CREATININE 1.87 (H) 06/07/2021     6. Bradycardia No syncopal or near syncopal episodes  7. Morbid obesity ([place]) Weight is up 6 lbs. Wt Readings from Last 3 Encounters:  09/12/21 (!) 335 lb (152 kg)  06/07/21 (!) 329 lb (149.2 kg)  02/22/21 (!) 333 lb (151 kg)   BMI Readings from Last 3 Encounters:  09/12/21 44.20 kg/m  06/07/21 43.41 kg/m  02/22/21 43.93 kg/m      New complaints: None today  Allergies  Allergen Reactions   Lipitor [Atorvastatin] Other (See Comments)    Muscle aches   Outpatient Encounter Medications as of 09/12/2021   Medication Sig   acetaminophen (TYLENOL) 500 MG tablet Take 1,000 mg by mouth every 6 (six) hours as needed for mild pain or moderate pain.   amLODipine (NORVASC) 10 MG tablet Take 1 tablet (10 mg total) by mouth daily.   Ascorbic Acid (VITAMIN C) 500 MG CAPS Take by mouth.   aspirin 81 MG tablet Take 81 mg by mouth daily.   Cholecalciferol (DIALYVITE VITAMIN D 5000 PO) Take by mouth.   Coenzyme Q10 (CO Q 10 PO) Take by mouth.   colchicine 0.6 MG tablet 2 tablets PO at gout onswet, may repeat 1x in 1 hour. No more then 3 per day.   fenofibrate (TRICOR) 145 MG tablet TAKE 1 TABLET BY MOUTH DAILY   Ferrous Sulfate 50 MG TBCR Take by mouth.   fluticasone (FLONASE) 50 MCG/ACT nasal spray Place 2 sprays into both nostrils every morning.    metoprolol succinate (TOPROL-XL) 100 MG 24 hr tablet Take 1 tablet (100 mg total) by mouth daily. Take with or immediately following a meal in morning   Multiple Vitamin (MULTIVITAMIN) capsule Take 1 capsule by mouth every morning.    Multiple Vitamins-Minerals (ZINC PO) Take by mouth.   rosuvastatin (CRESTOR) 10 MG tablet Take 1 tablet (10 mg total) by mouth daily.   spironolactone (ALDACTONE) 25 MG tablet Take 1 tablet (25 mg total) by mouth  daily.   valsartan-hydrochlorothiazide (DIOVAN-HCT) 320-25 MG tablet Take 1 tablet by mouth daily.   No facility-administered encounter medications on file as of 09/12/2021.    Past Surgical History:  Procedure Laterality Date   RETINAL DETACHMENT SURGERY      Family History  Problem Relation Age of Onset   CAD Mother 55   CAD Father 67       CABG, died age 51   Colon cancer Neg Hx    Esophageal cancer Neg Hx    Stomach cancer Neg Hx    Rectal cancer Neg Hx       Controlled substance contract: n/a     Review of Systems  Constitutional:  Negative for diaphoresis.  Eyes:  Negative for pain.  Respiratory:  Negative for shortness of breath.   Cardiovascular:  Negative for chest pain, palpitations and  leg swelling.  Gastrointestinal:  Negative for abdominal pain.  Endocrine: Negative for polydipsia.  Skin:  Negative for rash.  Neurological:  Negative for dizziness, weakness and headaches.  Hematological:  Does not bruise/bleed easily.  All other systems reviewed and are negative.      Objective:   Physical Exam Vitals and nursing note reviewed.  Constitutional:      Appearance: Normal appearance. He is well-developed.  HENT:     Head: Normocephalic.     Nose: Nose normal.     Mouth/Throat:     Mouth: Mucous membranes are moist.     Pharynx: Oropharynx is clear.  Eyes:     Pupils: Pupils are equal, round, and reactive to light.  Neck:     Thyroid: No thyroid mass or thyromegaly.     Vascular: No carotid bruit or JVD.     Trachea: Phonation normal.  Cardiovascular:     Rate and Rhythm: Normal rate and regular rhythm.  Pulmonary:     Effort: Pulmonary effort is normal. No respiratory distress.     Breath sounds: Normal breath sounds.  Abdominal:     General: Bowel sounds are normal.     Palpations: Abdomen is soft.     Tenderness: There is no abdominal tenderness.  Musculoskeletal:        General: Normal range of motion.     Cervical back: Normal range of motion and neck supple.  Lymphadenopathy:     Cervical: No cervical adenopathy.  Skin:    General: Skin is warm and dry.  Neurological:     Mental Status: He is alert and oriented to person, place, and time.  Psychiatric:        Behavior: Behavior normal.        Thought Content: Thought content normal.        Judgment: Judgment normal.     BP 122/78   Pulse 65   Temp 97.8 F (36.6 C) (Temporal)   Resp 20   Ht 6' 1"  (1.854 m)   Wt (!) 335 lb (152 kg)   SpO2 96%   BMI 44.20 kg/m   HGBA1c 6.5%      Assessment & Plan:  Ian Stewart comes in today with chief complaint of Medical Management of Chronic Issues   Diagnosis and orders addressed:  1. Essential hypertension Low sodum diet - CBC with  Differential/Platelet - CMP14+EGFR - metoprolol succinate (TOPROL-XL) 100 MG 24 hr tablet; Take 1 tablet (100 mg total) by mouth daily. Take with or immediately following a meal in morning  Dispense: 90 tablet; Refill: 1 - spironolactone (ALDACTONE) 25 MG tablet; Take  1 tablet (25 mg total) by mouth daily.  Dispense: 90 tablet; Refill: 1 - amLODipine (NORVASC) 10 MG tablet; Take 1 tablet (10 mg total) by mouth daily.  Dispense: 90 tablet; Refill: 1 - valsartan-hydrochlorothiazide (DIOVAN-HCT) 320-25 MG tablet; Take 1 tablet by mouth daily.  Dispense: 90 tablet; Refill: 1  2. Hyperlipidemia with target LDL less than 100 Low fat diet - Lipid panel - fenofibrate (TRICOR) 145 MG tablet; Take 1 tablet (145 mg total) by mouth daily.  Dispense: 90 tablet; Refill: 1 - rosuvastatin (CRESTOR) 10 MG tablet; Take 1 tablet (10 mg total) by mouth daily.  Dispense: 90 tablet; Refill: 1  3. Metabolic syndrome Continue to watch carbs in diet - Bayer DCA Hb A1c Waived - Microalbumin / creatinine urine ratio  4. Encounter for hepatitis C screening test for low risk patient Labs pending - Hepatitis C Antibody  5. Chronic kidney disease (CKD) stage G3a/A1, moderately decreased glomerular filtration rate (GFR) between 45-59 mL/min/1.73 square meter and albuminuria creatinine ratio less than 30 mg/g (HCC) Labs pending  6. Bradycardia  7. Morbid obesity ([place]) Discussed diet and exercise for person with BMI >25 Will recheck weight in 3-6 months    Labs pending Health Maintenance reviewed Diet and exercise encouraged  Follow up plan: 6 months   [name] Done, FNP

## 2021-09-13 LAB — LIPID PANEL
Chol/HDL Ratio: 7.2 ratio — ABNORMAL HIGH (ref 0.0–5.0)
Cholesterol, Total: 195 mg/dL (ref 100–199)
HDL: 27 mg/dL — ABNORMAL LOW (ref >39)
LDL Chol Calc (NIH): 98 mg/dL (ref 0–99)
Triglycerides: 417 mg/dL — ABNORMAL HIGH (ref 0–149)
VLDL Cholesterol Cal: 70 mg/dL — ABNORMAL HIGH (ref 5–40)

## 2021-09-13 LAB — CMP14+EGFR
ALT: 23 IU/L (ref 0–44)
AST: 27 IU/L (ref 0–40)
Albumin/Globulin Ratio: 2.1 (ref 1.2–2.2)
Albumin: 4.6 g/dL (ref 3.8–4.9)
Alkaline Phosphatase: 36 IU/L — ABNORMAL LOW (ref 44–121)
BUN/Creatinine Ratio: 19 (ref 9–20)
BUN: 37 mg/dL — ABNORMAL HIGH (ref 6–24)
Bilirubin Total: 0.3 mg/dL (ref 0.0–1.2)
CO2: 21 mmol/L (ref 20–29)
Calcium: 10.4 mg/dL — ABNORMAL HIGH (ref 8.7–10.2)
Chloride: 102 mmol/L (ref 96–106)
Creatinine, Ser: 1.93 mg/dL — ABNORMAL HIGH (ref 0.76–1.27)
Globulin, Total: 2.2 g/dL (ref 1.5–4.5)
Glucose: 148 mg/dL — ABNORMAL HIGH (ref 70–99)
Potassium: 4.8 mmol/L (ref 3.5–5.2)
Sodium: 143 mmol/L (ref 134–144)
Total Protein: 6.8 g/dL (ref 6.0–8.5)
eGFR: 41 mL/min/{1.73_m2} — ABNORMAL LOW (ref >59)

## 2021-09-13 LAB — CBC WITH DIFFERENTIAL/PLATELET
Basophils Absolute: 0 10*3/uL (ref 0.0–0.2)
Basos: 1 %
EOS (ABSOLUTE): 0.1 10*3/uL (ref 0.0–0.4)
Eos: 2 %
Hematocrit: 38.8 % (ref 37.5–51.0)
Hemoglobin: 13.1 g/dL (ref 13.0–17.7)
Immature Grans (Abs): 0 10*3/uL (ref 0.0–0.1)
Immature Granulocytes: 0 %
Lymphocytes Absolute: 1.8 10*3/uL (ref 0.7–3.1)
Lymphs: 39 %
MCH: 30.2 pg (ref 26.6–33.0)
MCHC: 33.8 g/dL (ref 31.5–35.7)
MCV: 89 fL (ref 79–97)
Monocytes Absolute: 0.5 10*3/uL (ref 0.1–0.9)
Monocytes: 11 %
Neutrophils Absolute: 2.1 10*3/uL (ref 1.4–7.0)
Neutrophils: 47 %
Platelets: 252 10*3/uL (ref 150–450)
RBC: 4.34 x10E6/uL (ref 4.14–5.80)
RDW: 13.5 % (ref 11.6–15.4)
WBC: 4.5 10*3/uL (ref 3.4–10.8)

## 2021-09-13 LAB — HEPATITIS C ANTIBODY: Hep C Virus Ab: NONREACTIVE

## 2021-09-13 LAB — MICROALBUMIN / CREATININE URINE RATIO
Creatinine, Urine: 105.1 mg/dL
Microalb/Creat Ratio: 623 mg/g creat — ABNORMAL HIGH (ref 0–29)
Microalbumin, Urine: 655.1 ug/mL

## 2021-12-16 DIAGNOSIS — Z23 Encounter for immunization: Secondary | ICD-10-CM | POA: Diagnosis not present

## 2022-01-27 ENCOUNTER — Other Ambulatory Visit: Payer: Self-pay | Admitting: Nurse Practitioner

## 2022-01-27 DIAGNOSIS — I10 Essential (primary) hypertension: Secondary | ICD-10-CM

## 2022-01-28 ENCOUNTER — Other Ambulatory Visit: Payer: Self-pay | Admitting: Nurse Practitioner

## 2022-01-28 DIAGNOSIS — E785 Hyperlipidemia, unspecified: Secondary | ICD-10-CM

## 2022-02-24 ENCOUNTER — Ambulatory Visit: Payer: BC Managed Care – PPO | Admitting: Cardiology

## 2022-02-27 ENCOUNTER — Ambulatory Visit: Payer: BC Managed Care – PPO | Admitting: Cardiology

## 2022-03-09 ENCOUNTER — Telehealth: Payer: BC Managed Care – PPO | Admitting: Family

## 2022-03-09 DIAGNOSIS — U071 COVID-19: Secondary | ICD-10-CM

## 2022-03-09 MED ORDER — MOLNUPIRAVIR EUA 200MG CAPSULE: 4.0000 | ORAL_CAPSULE | Freq: Two times a day (BID) | ORAL | 0 refills | Status: AC

## 2022-03-09 NOTE — Progress Notes (Signed)
Virtual Visit Consent   Ian Stewart, you are scheduled for a virtual visit with a [facility] provider today. Just as with appointments in the office, your consent must be obtained to participate. Your consent will be active for this visit and any virtual visit you may have with one of our providers in the next 365 days. If you have a MyChart account, a copy of this consent can be sent to you electronically.  As this is a virtual visit, video technology does not allow for your provider to perform a traditional examination. This may limit your provider's ability to fully assess your condition. If your provider identifies any concerns that need to be evaluated in person or the need to arrange testing (such as labs, EKG, etc.), we will make arrangements to do so. Although advances in technology are sophisticated, we cannot ensure that it will always work on either your end or our end. If the connection with a video visit is poor, the visit may have to be switched to a telephone visit. With either a video or telephone visit, we are not always able to ensure that we have a secure connection.  By engaging in this virtual visit, you consent to the provision of healthcare and authorize for your insurance to be billed (if applicable) for the services provided during this visit. Depending on your insurance coverage, you may receive a charge related to this service.  I need to obtain your verbal consent now. Are you willing to proceed with your visit today? Chinonso Linker has provided verbal consent on 03/09/2022 for a virtual visit (video or telephone). Evelina Dun, FNP  Date: 03/09/2022 4:32 PM  Virtual Visit via Video Note   I, Evelina Dun, connected with  Ian Stewart  ([id number], Jul 31, 1970) on 03/09/22 at  5:00 PM EST by a video-enabled telemedicine application and verified that I am speaking with the correct person using two identifiers.  Location: Patient: Virtual Visit Location Patient:  Home Provider: Virtual Visit Location Provider: Home Office   I discussed the limitations of evaluation and management by telemedicine and the availability of in person appointments. The patient expressed understanding and agreed to proceed.    History of Present Illness: Ian Stewart is a 51 y.o. who identifies as a male who was assigned male at birth, and is being seen today for COVID. He reports his symptoms started yesterday.   HPI: URI  This is a new problem. The current episode started in the past 7 days. The problem has been gradually worsening. There has been no fever. Associated symptoms include congestion, headaches, rhinorrhea, sinus pain, sneezing and a sore throat. Pertinent negatives include no coughing, ear pain, nausea or vomiting. He has tried decongestant for the symptoms. The treatment provided mild relief.    Problems:  Patient Active Problem List   Diagnosis Date Noted   Metabolic syndrome 26/41/5830   Chronic kidney disease (CKD) stage G3a/A1, moderately decreased glomerular filtration rate (GFR) between 45-59 mL/min/1.73 square meter and albuminuria creatinine ratio less than 30 mg/g ([place]) 11/05/2018   Morbid obesity ([place]) 02/22/2018   Bradycardia 12/22/2017   Essential hypertension 12/15/2013   Hyperlipidemia with target LDL less than 100 12/15/2013    Allergies:  Allergies  Allergen Reactions   Lipitor [Atorvastatin] Other (See Comments)    Muscle aches   Medications:  Current Outpatient Medications:    molnupiravir EUA (LAGEVRIO) 200 mg CAPS capsule, Take 4 capsules (800 mg total) by mouth 2 (two) times daily for 5 days.,  Disp: 40 capsule, Rfl: 0   acetaminophen (TYLENOL) 500 MG tablet, Take 1,000 mg by mouth every 6 (six) hours as needed for mild pain or moderate pain., Disp: , Rfl:    amLODipine (NORVASC) 10 MG tablet, Take 1 tablet (10 mg total) by mouth daily., Disp: 90 tablet, Rfl: 1   Ascorbic Acid (VITAMIN C) 500 MG CAPS, Take by mouth., Disp: , Rfl:     aspirin 81 MG tablet, Take 81 mg by mouth daily., Disp: , Rfl:    Cholecalciferol (DIALYVITE VITAMIN D 5000 PO), Take by mouth., Disp: , Rfl:    Coenzyme Q10 (CO Q 10 PO), Take by mouth., Disp: , Rfl:    colchicine 0.6 MG tablet, 2 tablets PO at gout onswet, may repeat 1x in 1 hour. No more then 3 per day. (Patient not taking: Reported on 09/12/2021), Disp: 30 tablet, Rfl: 1   fenofibrate (TRICOR) 145 MG tablet, TAKE 1 TABLET BY MOUTH DAILY, Disp: 90 tablet, Rfl: 0   Ferrous Sulfate 50 MG TBCR, Take by mouth., Disp: , Rfl:    fluticasone (FLONASE) 50 MCG/ACT nasal spray, Place 2 sprays into both nostrils every morning. , Disp: , Rfl:    metoprolol succinate (TOPROL-XL) 100 MG 24 hr tablet, TAKE 1 TABLET BY MOUTH DAILY  TAKE WITH OR IMMEDIATELY  FOLLOWING A MEAL IN THE MORNING, Disp: 90 tablet, Rfl: 0   Multiple Vitamin (MULTIVITAMIN) capsule, Take 1 capsule by mouth every morning. , Disp: , Rfl:    Multiple Vitamins-Minerals (ZINC PO), Take by mouth., Disp: , Rfl:    rosuvastatin (CRESTOR) 10 MG tablet, Take 1 tablet (10 mg total) by mouth daily., Disp: 90 tablet, Rfl: 1   spironolactone (ALDACTONE) 25 MG tablet, Take 1 tablet (25 mg total) by mouth daily., Disp: 90 tablet, Rfl: 1   valsartan-hydrochlorothiazide (DIOVAN-HCT) 320-25 MG tablet, Take 1 tablet by mouth daily., Disp: 90 tablet, Rfl: 1  Observations/Objective: Patient is well-developed, well-nourished in no acute distress.  Resting comfortably  at home.  Head is normocephalic, atraumatic.  No labored breathing.  Speech is clear and coherent with logical content.  Patient is alert and oriented at baseline.  Nasal congestion   Assessment and Plan: 1. Positive self-administered antigen test for COVID-19 - molnupiravir EUA (LAGEVRIO) 200 mg CAPS capsule; Take 4 capsules (800 mg total) by mouth 2 (two) times daily for 5 days.  Dispense: 40 capsule; Refill: 0  COVID positive, rest, force fluids, tylenol as needed, Quarantine for at  least 5 days and you are fever free, then must wear a mask out in public from day 4-33, report any worsening symptoms such as increased shortness of breath, swelling, or continued high fevers. Possible adverse effects discussed with antivirals.    Follow Up Instructions: I discussed the assessment and treatment plan with the patient. The patient was provided an opportunity to ask questions and all were answered. The patient agreed with the plan and demonstrated an understanding of the instructions.  A copy of instructions were sent to the patient via MyChart unless otherwise noted below.     The patient was advised to call back or seek an in-person evaluation if the symptoms worsen or if the condition fails to improve as anticipated.  Time:  I spent 11 minutes with the patient via telehealth technology discussing the above problems/concerns.    Evelina Dun, FNP

## 2022-03-10 ENCOUNTER — Other Ambulatory Visit: Payer: Self-pay | Admitting: Nurse Practitioner

## 2022-03-10 DIAGNOSIS — I10 Essential (primary) hypertension: Secondary | ICD-10-CM

## 2022-03-17 ENCOUNTER — Ambulatory Visit: Payer: BC Managed Care – PPO | Admitting: Nurse Practitioner

## 2022-03-19 ENCOUNTER — Encounter: Payer: Self-pay | Admitting: Nurse Practitioner

## 2022-03-19 ENCOUNTER — Ambulatory Visit: Payer: BC Managed Care – PPO | Admitting: Nurse Practitioner

## 2022-03-19 VITALS — BP 133/84 | HR 67 | Temp 97.5°F | Ht 73.0 in | Wt 317.6 lb

## 2022-03-19 DIAGNOSIS — I10 Essential (primary) hypertension: Secondary | ICD-10-CM | POA: Diagnosis not present

## 2022-03-19 DIAGNOSIS — E8881 Metabolic syndrome: Secondary | ICD-10-CM

## 2022-03-19 DIAGNOSIS — R001 Bradycardia, unspecified: Secondary | ICD-10-CM

## 2022-03-19 DIAGNOSIS — N1831 Chronic kidney disease, stage 3a: Secondary | ICD-10-CM | POA: Diagnosis not present

## 2022-03-19 DIAGNOSIS — I129 Hypertensive chronic kidney disease with stage 1 through stage 4 chronic kidney disease, or unspecified chronic kidney disease: Secondary | ICD-10-CM | POA: Diagnosis not present

## 2022-03-19 DIAGNOSIS — R739 Hyperglycemia, unspecified: Secondary | ICD-10-CM | POA: Diagnosis not present

## 2022-03-19 DIAGNOSIS — E785 Hyperlipidemia, unspecified: Secondary | ICD-10-CM | POA: Diagnosis not present

## 2022-03-19 DIAGNOSIS — Z6841 Body Mass Index (BMI) 40.0 and over, adult: Secondary | ICD-10-CM

## 2022-03-19 LAB — BAYER DCA HB A1C WAIVED: HB A1C (BAYER DCA - WAIVED): 6.5 % — ABNORMAL HIGH (ref 4.8–5.6)

## 2022-03-19 MED ORDER — FENOFIBRATE 145 MG PO TABS: 145.0000 mg | ORAL_TABLET | Freq: Every day | ORAL | 1 refills | Status: DC

## 2022-03-19 MED ORDER — METOPROLOL SUCCINATE ER 100 MG PO TB24: ORAL_TABLET | ORAL | 1 refills | Status: DC

## 2022-03-19 MED ORDER — VALSARTAN-HYDROCHLOROTHIAZIDE 320-25 MG PO TABS: 1.0000 | ORAL_TABLET | Freq: Every day | ORAL | 1 refills | Status: DC

## 2022-03-19 MED ORDER — ROSUVASTATIN CALCIUM 10 MG PO TABS: 10.0000 mg | ORAL_TABLET | Freq: Every day | ORAL | 1 refills | Status: DC

## 2022-03-19 MED ORDER — SPIRONOLACTONE 25 MG PO TABS: 25.0000 mg | ORAL_TABLET | Freq: Every day | ORAL | 1 refills | Status: DC

## 2022-03-19 MED ORDER — AMLODIPINE BESYLATE 10 MG PO TABS: 10.0000 mg | ORAL_TABLET | Freq: Every day | ORAL | 1 refills | Status: DC

## 2022-03-19 NOTE — Progress Notes (Signed)
Subjective:    Patient ID: Ian Stewart, male    DOB: March 28, 1970, 52 y.o.   MRN: [id number]   Chief Complaint: medical management of chronic issues     HPI:  Ian Stewart is a 52 y.o. who identifies as a male who was assigned male at birth.   Social history: Lives with: by himeslf Work history: [employer]   Comes in today for follow up of the following chronic medical issues:  1. Essential hypertension No c/o chest  pain, sob or headache. Does not check blood pressure at home. BP Readings from Last 3 Encounters:  09/12/21 122/78  06/07/21 131/89  02/22/21 132/88     2. Hyperlipidemia with target LDL less than 100 Doe snot watch diet and does no dedicated exercise. Lab Results  Component Value Date   CHOL 195 09/12/2021   HDL 27 (L) 09/12/2021   LDLCALC 98 09/12/2021   TRIG 417 (H) 09/12/2021   CHOLHDL 7.2 (H) 09/12/2021     3. Metabolic syndrome Doe snot check his blood sugars at home. Lab Results  Component Value Date   HGBA1C 6.5 (H) 09/12/2021     4. Bradycardia No syncopal or near syncopal episodes Pulse Readings from Last 3 Encounters:  09/12/21 65  06/07/21 69  02/22/21 67     5. Chronic kidney disease (CKD) stage G3a/A1, moderately decreased glomerular filtration rate (GFR) between 45-59 mL/min/1.73 square meter and albuminuria creatinine ratio less than 30 mg/g (HCC) No voiding issues and no edemea Lab Results  Component Value Date   CREATININE 1.93 (H) 09/12/2021     6. Morbid obesity ([place]) Weight is down 18 lbs Wt Readings from Last 3 Encounters:  03/19/22 (!) 317 lb 9.6 oz (144.1 kg)  09/12/21 (!) 335 lb (152 kg)  06/07/21 (!) 329 lb (149.2 kg)   BMI Readings from Last 3 Encounters:  03/19/22 41.90 kg/m  09/12/21 44.20 kg/m  06/07/21 43.41 kg/m     New complaints: None today  Allergies  Allergen Reactions   Lipitor [Atorvastatin] Other (See Comments)    Muscle aches   Outpatient Encounter Medications as of 03/19/2022   Medication Sig   acetaminophen (TYLENOL) 500 MG tablet Take 1,000 mg by mouth every 6 (six) hours as needed for mild pain or moderate pain.   amLODipine (NORVASC) 10 MG tablet TAKE 1 TABLET BY MOUTH DAILY   Ascorbic Acid (VITAMIN C) 500 MG CAPS Take by mouth.   aspirin 81 MG tablet Take 81 mg by mouth daily.   Cholecalciferol (DIALYVITE VITAMIN D 5000 PO) Take by mouth.   Coenzyme Q10 (CO Q 10 PO) Take by mouth.   colchicine 0.6 MG tablet 2 tablets PO at gout onswet, may repeat 1x in 1 hour. No more then 3 per day. (Patient not taking: Reported on 09/12/2021)   fenofibrate (TRICOR) 145 MG tablet TAKE 1 TABLET BY MOUTH DAILY   Ferrous Sulfate 50 MG TBCR Take by mouth.   fluticasone (FLONASE) 50 MCG/ACT nasal spray Place 2 sprays into both nostrils every morning.    metoprolol succinate (TOPROL-XL) 100 MG 24 hr tablet TAKE 1 TABLET BY MOUTH DAILY  TAKE WITH OR IMMEDIATELY  FOLLOWING A MEAL IN THE MORNING   Multiple Vitamin (MULTIVITAMIN) capsule Take 1 capsule by mouth every morning.    Multiple Vitamins-Minerals (ZINC PO) Take by mouth.   rosuvastatin (CRESTOR) 10 MG tablet Take 1 tablet (10 mg total) by mouth daily.   spironolactone (ALDACTONE) 25 MG tablet Take 1 tablet (25  mg total) by mouth daily.   valsartan-hydrochlorothiazide (DIOVAN-HCT) 320-25 MG tablet TAKE 1 TABLET BY MOUTH DAILY   No facility-administered encounter medications on file as of 03/19/2022.    Past Surgical History:  Procedure Laterality Date   RETINAL DETACHMENT SURGERY      Family History  Problem Relation Age of Onset   CAD Mother 58   CAD Father 49       CABG, died age 72   Colon cancer Neg Hx    Esophageal cancer Neg Hx    Stomach cancer Neg Hx    Rectal cancer Neg Hx       Controlled substance contract: n/a     Review of Systems  Constitutional:  Negative for diaphoresis.  Eyes:  Negative for pain.  Respiratory:  Negative for shortness of breath.   Cardiovascular:  Negative for chest pain,  palpitations and leg swelling.  Gastrointestinal:  Negative for abdominal pain.  Endocrine: Negative for polydipsia.  Skin:  Negative for rash.  Neurological:  Negative for dizziness, weakness and headaches.  Hematological:  Does not bruise/bleed easily.  All other systems reviewed and are negative.      Objective:   Physical Exam Vitals and nursing note reviewed.  Constitutional:      Appearance: Normal appearance. He is well-developed.  HENT:     Head: Normocephalic.     Nose: Nose normal.     Mouth/Throat:     Mouth: Mucous membranes are moist.     Pharynx: Oropharynx is clear.  Eyes:     Pupils: Pupils are equal, round, and reactive to light.  Neck:     Thyroid: No thyroid mass or thyromegaly.     Vascular: No carotid bruit or JVD.     Trachea: Phonation normal.  Cardiovascular:     Rate and Rhythm: Normal rate and regular rhythm.  Pulmonary:     Effort: Pulmonary effort is normal. No respiratory distress.     Breath sounds: Normal breath sounds.  Abdominal:     General: Bowel sounds are normal.     Palpations: Abdomen is soft.     Tenderness: There is no abdominal tenderness.  Musculoskeletal:        General: Normal range of motion.     Cervical back: Normal range of motion and neck supple.  Lymphadenopathy:     Cervical: No cervical adenopathy.  Skin:    General: Skin is warm and dry.  Neurological:     Mental Status: He is alert and oriented to person, place, and time.  Psychiatric:        Behavior: Behavior normal.        Thought Content: Thought content normal.        Judgment: Judgment normal.    BP 133/84   Pulse 67   Temp (!) 97.5 F (36.4 C) (Temporal)   Ht '6\' 1"'$  (1.854 m)   Wt (!) 317 lb 9.6 oz (144.1 kg)   SpO2 98%   BMI 41.90 kg/m         Assessment & Plan:  [name] comes in today with chief complaint of Medical Management of Chronic Issues   Diagnosis and orders addressed:  1. Essential hypertension Low sodium diet -  amLODipine (NORVASC) 10 MG tablet; Take 1 tablet (10 mg total) by mouth daily.  Dispense: 90 tablet; Refill: 1 - valsartan-hydrochlorothiazide (DIOVAN-HCT) 320-25 MG tablet; Take 1 tablet by mouth daily.  Dispense: 90 tablet; Refill: 1 - metoprolol succinate (TOPROL-XL) 100 MG  24 hr tablet; TAKE 1 TABLET BY MOUTH DAILY  TAKE WITH OR IMMEDIATELY  FOLLOWING A MEAL IN THE MORNING  Dispense: 90 tablet; Refill: 1 - spironolactone (ALDACTONE) 25 MG tablet; Take 1 tablet (25 mg total) by mouth daily.  Dispense: 90 tablet; Refill: 1 - CBC with Differential/Platelet - CMP14+EGFR  2. Hyperlipidemia with target LDL less than 100 Low fat diet - fenofibrate (TRICOR) 145 MG tablet; Take 1 tablet (145 mg total) by mouth daily.  Dispense: 90 tablet; Refill: 1 - rosuvastatin (CRESTOR) 10 MG tablet; Take 1 tablet (10 mg total) by mouth daily.  Dispense: 90 tablet; Refill: 1 - Lipid panel  3. Metabolic syndrome Watch carbs in diet  4. Bradycardia  5. Chronic kidney disease (CKD) stage G3a/A1, moderately decreased glomerular filtration rate (GFR) between 45-59 mL/min/1.73 square meter and albuminuria creatinine ratio less than 30 mg/g (HCC) Labs oending  6. Morbid obesity ([place]) Discussed diet and exercise for person with BMI >25 Will recheck weight in 3-6 months    Labs pending Health Maintenance reviewed Diet and exercise encouraged  Follow up plan: 6 months   [name] Done, FNP

## 2022-03-19 NOTE — Patient Instructions (Signed)

## 2022-03-20 ENCOUNTER — Other Ambulatory Visit: Payer: Self-pay

## 2022-03-20 DIAGNOSIS — R7989 Other specified abnormal findings of blood chemistry: Secondary | ICD-10-CM

## 2022-03-20 LAB — CBC WITH DIFFERENTIAL/PLATELET
Basophils Absolute: 0 10*3/uL (ref 0.0–0.2)
Basos: 1 %
EOS (ABSOLUTE): 0.1 10*3/uL (ref 0.0–0.4)
Eos: 2 %
Hematocrit: 40.6 % (ref 37.5–51.0)
Hemoglobin: 13.5 g/dL (ref 13.0–17.7)
Immature Grans (Abs): 0 10*3/uL (ref 0.0–0.1)
Immature Granulocytes: 0 %
Lymphocytes Absolute: 2.1 10*3/uL (ref 0.7–3.1)
Lymphs: 41 %
MCH: 29.7 pg (ref 26.6–33.0)
MCHC: 33.3 g/dL (ref 31.5–35.7)
MCV: 89 fL (ref 79–97)
Monocytes Absolute: 0.6 10*3/uL (ref 0.1–0.9)
Monocytes: 11 %
Neutrophils Absolute: 2.3 10*3/uL (ref 1.4–7.0)
Neutrophils: 45 %
Platelets: 268 10*3/uL (ref 150–450)
RBC: 4.55 x10E6/uL (ref 4.14–5.80)
RDW: 14.7 % (ref 11.6–15.4)
WBC: 5.2 10*3/uL (ref 3.4–10.8)

## 2022-03-20 LAB — CMP14+EGFR
ALT: 28 IU/L (ref 0–44)
AST: 28 IU/L (ref 0–40)
Albumin/Globulin Ratio: 1.6 (ref 1.2–2.2)
Albumin: 4.5 g/dL (ref 3.8–4.9)
Alkaline Phosphatase: 36 IU/L — ABNORMAL LOW (ref 44–121)
BUN/Creatinine Ratio: 18 (ref 9–20)
BUN: 40 mg/dL — ABNORMAL HIGH (ref 6–24)
Bilirubin Total: 0.4 mg/dL (ref 0.0–1.2)
CO2: 21 mmol/L (ref 20–29)
Calcium: 10.7 mg/dL — ABNORMAL HIGH (ref 8.7–10.2)
Chloride: 101 mmol/L (ref 96–106)
Creatinine, Ser: 2.21 mg/dL — ABNORMAL HIGH (ref 0.76–1.27)
Globulin, Total: 2.8 g/dL (ref 1.5–4.5)
Glucose: 135 mg/dL — ABNORMAL HIGH (ref 70–99)
Potassium: 4.6 mmol/L (ref 3.5–5.2)
Sodium: 139 mmol/L (ref 134–144)
Total Protein: 7.3 g/dL (ref 6.0–8.5)
eGFR: 35 mL/min/{1.73_m2} — ABNORMAL LOW (ref >59)

## 2022-03-20 LAB — LIPID PANEL
Chol/HDL Ratio: 9.9 ratio — ABNORMAL HIGH (ref 0.0–5.0)
Cholesterol, Total: 217 mg/dL — ABNORMAL HIGH (ref 100–199)
HDL: 22 mg/dL — ABNORMAL LOW (ref >39)
LDL Chol Calc (NIH): 111 mg/dL — ABNORMAL HIGH (ref 0–99)
Triglycerides: 481 mg/dL — ABNORMAL HIGH (ref 0–149)
VLDL Cholesterol Cal: 84 mg/dL — ABNORMAL HIGH (ref 5–40)

## 2022-04-22 ENCOUNTER — Ambulatory Visit: Payer: BC Managed Care – PPO | Admitting: Cardiology

## 2022-04-22 NOTE — Progress Notes (Unsigned)
Cardiology Office Note:    Date:  04/23/2022   ID:  Ian Stewart, DOB Dec 02, 1970, MRN [id number]  PCP:  Chevis Pretty, [place] Cardiologist: Minus Breeding, MD    Reason for visit: 12 month follow-up  History of Present Illness:    Ian Stewart is a 52 y.o. male with a hx of hypertension, hyperlipidemia, bradycardia cardiomyopathy, CKD, DM (A1C 6.5% in 03/2022).  He last saw Dr. Percival Spanish in 02/2021.  Reccommended weight loss and plant-based diet to help with hyperlipidemia.  BP controlled, GDMT for CMO and no symptoms from bradycardia.   Today, he states he is doing well from a symptom standpoint.  He shows me a Pharmacist, community message from his PCP with concern of increasing creatinine and uncontrolled cholesterol.  His last creatinine was 2.2.  His last LDL was 111 and triglycerides 481.    He denies chest pain.  He states he has shortness of breath with significant exertion that is unchanged.  He has no significant LE edema.  He denies PND, orthopnea, palpitations, lightheadedness and syncope.  He denies side effects from medications.  He states he has had issues finding a good diet plan because he feels limited by restrictions of CKD diet, diabetic diet and diverticulosis.      Past Medical History:  Diagnosis Date   Arrhythmia    BRADYCARDIA/ 41-50 BEATS   Chronic kidney disease    monitoring kidey function due to other medications   Hyperlipidemia    Hypertension     Past Surgical History:  Procedure Laterality Date   RETINAL DETACHMENT SURGERY      Current Medications: Current Meds  Medication Sig   acetaminophen (TYLENOL) 500 MG tablet Take 1,000 mg by mouth every 6 (six) hours as needed for mild pain or moderate pain.   amLODipine (NORVASC) 10 MG tablet Take 1 tablet (10 mg total) by mouth daily.   Ascorbic Acid (VITAMIN C) 500 MG CAPS Take by mouth.   aspirin 81 MG tablet Take 81 mg by mouth daily.   Cholecalciferol (DIALYVITE VITAMIN D 5000 PO) Take  by mouth.   Coenzyme Q10 (CO Q 10 PO) Take by mouth.   colchicine 0.6 MG tablet 2 tablets PO at gout onswet, may repeat 1x in 1 hour. No more then 3 per day.   fenofibrate (TRICOR) 145 MG tablet Take 1 tablet (145 mg total) by mouth daily.   Ferrous Sulfate 50 MG TBCR Take by mouth.   fluticasone (FLONASE) 50 MCG/ACT nasal spray Place 2 sprays into both nostrils every morning.    metoprolol succinate (TOPROL-XL) 100 MG 24 hr tablet TAKE 1 TABLET BY MOUTH DAILY  TAKE WITH OR IMMEDIATELY  FOLLOWING A MEAL IN THE MORNING   Multiple Vitamin (MULTIVITAMIN) capsule Take 1 capsule by mouth every morning.    Multiple Vitamins-Minerals (ZINC PO) Take by mouth.   rosuvastatin (CRESTOR) 40 MG tablet Take 1 tablet (40 mg total) by mouth daily.   valsartan-hydrochlorothiazide (DIOVAN-HCT) 320-25 MG tablet Take 1 tablet by mouth daily.   [DISCONTINUED] rosuvastatin (CRESTOR) 10 MG tablet Take 1 tablet (10 mg total) by mouth daily.   [DISCONTINUED] spironolactone (ALDACTONE) 25 MG tablet Take 1 tablet (25 mg total) by mouth daily.     Allergies:   Lipitor [atorvastatin]   Social History   Socioeconomic History   Marital status: Single    Spouse name: Not on file   Number of children: Not on file   Years of education: Not on file  Highest education level: Not on file  Occupational History   Not on file  Tobacco Use   Smoking status: Never   Smokeless tobacco: Never  Vaping Use   Vaping Use: Never used  Substance and Sexual Activity   Alcohol use: No   Drug use: No   Sexual activity: Not on file  Other Topics Concern   Not on file  Social History Narrative   Works at [employer] and lives alone.     Social Determinants of Health   Financial Resource Strain: Not on file  Food Insecurity: Not on file  Transportation Needs: Not on file  Physical Activity: Not on file  Stress: Not on file  Social Connections: Not on file     Family History: The patient's family history includes CAD (age of  onset: 12) in his father; CAD (age of onset: 46) in his mother. There is no history of Colon cancer, Esophageal cancer, Stomach cancer, or Rectal cancer.  ROS:   Please see the history of present illness.     EKGs/Labs/Other Studies Reviewed:    EKG:  The ekg ordered today demonstrates normal sinus rhythm, left axis deviation, nonspecific T wave abnormality, heart rate 65.  Recent Labs: 03/19/2022: ALT 28; BUN 40; Creatinine, Ser 2.21; Hemoglobin 13.5; Platelets 268; Potassium 4.6; Sodium 139   Recent Lipid Panel Lab Results  Component Value Date/Time   CHOL 217 (H) 03/19/2022 08:51 AM   TRIG 481 (H) 03/19/2022 08:51 AM   TRIG 263 (H) 07/20/2014 02:50 PM   HDL 22 (L) 03/19/2022 08:51 AM   HDL 37 (L) 07/20/2014 02:50 PM   LDLCALC 111 (H) 03/19/2022 08:51 AM    Physical Exam:    VS:  BP (!) 138/90   Pulse 65   Ht 6' 1"$  (1.854 m)   Wt (!) 323 lb 6.4 oz (146.7 kg)   SpO2 96%   BMI 42.67 kg/m    No data found.  Wt Readings from Last 3 Encounters:  04/23/22 (!) 323 lb 6.4 oz (146.7 kg)  03/19/22 (!) 317 lb 9.6 oz (144.1 kg)  09/12/21 (!) 335 lb (152 kg)     GEN:  Well nourished, well developed in no acute distress, obese HEENT: Normal NECK: No JVD; No carotid bruits CARDIAC: RRR, no murmurs, rubs, gallops RESPIRATORY:  Clear to auscultation without rales, wheezing or rhonchi  ABDOMEN: Soft, non-tender, non-distended MUSCULOSKELETAL: No edema SKIN: Warm and dry NEUROLOGIC:  Alert and oriented PSYCHIATRIC:  Normal affect     ASSESSMENT AND PLAN   Nonischemic cardiomyopathy, euvolemic -Echo 2021: EF 45-50%, Grade I DD, moderately enlarged RV, mod dilated atria, no obvious valve disease -Recommend sleep study -Stop spironolactone secondary to creatinine over 2. -Recheck BMET in 2 months.  Will need to reassess if adjustments need to be made to his valsartan HCTZ 320-25 mg daily.  Agree with PCP regarding nephrology referral. -Continue Toprol 100 mg  daily.  Bradycardia, stable -Watch heart rate log shows heart rate can go to as low as 40 at night.  Overall heart rate in normal range.  Denies lightheadedness and syncope. -Okay to continue Toprol.  Hypertension, BP slightly elevated -Recommend keeping blood pressure log as we are stopping spironolactone.   -May need to consider alternative blood pressure meds like hydralazine at his next appointment. -Goal BP is <130/80.  Recommend DASH diet (high in vegetables, fruits, low-fat dairy products, whole grains, poultry, fish, and nuts and low in sweets, sugar-sweetened beverages, and red meats), salt restriction and  increase physical activity.  Hyperlipidemia goal LDL less than 100 -Lipids 03/2022: Total 217, HDL 22, Trig 481, LDL 111 -Increase Crestor to 40 mg daily.  Continue Tricor.  Recommend recheck lipids in 3 months. -Recommend coronary calcium scoring to help assess coronary risk and help guide how aggressive to be with his lipid control. -Discussed cholesterol lowering diets - Mediterranean diet, DASH diet, vegetarian diet, low-carbohydrate diet and avoidance of trans fats.  Discussed healthier choice substitutes.  Nuts, high-fiber foods, and fiber supplements may also improve lipids.    Obesity -Discussed how even a 5-10% weight loss can have cardiovascular benefits.   -Recommend moderate intensity activity for 30 minutes 5 days/week and the DASH diet. -[facility] to [facility].  Disposition - Follow-up in 2 months.     Medication Adjustments/Labs and Tests Ordered: Current medicines are reviewed at length with the patient today.  Concerns regarding medicines are outlined above.  Orders Placed This Encounter  Procedures   CT CARDIAC SCORING (SELF PAY ONLY)   Basic metabolic panel   Amb Ref to [facility] Weight [facility]   EKG 12-Lead   Home sleep test   Meds ordered this encounter  Medications   rosuvastatin (CRESTOR) 40 MG tablet    Sig: Take 1  tablet (40 mg total) by mouth daily.    Dispense:  90 tablet    Refill:  3    Patient Instructions  Medication Instructions:  Stop Spironolactone. Increase Crestor from 10 mg to 40 mg ( Take 1 Tablet Daily). *If you need a refill on your cardiac medications before your next appointment, please call your pharmacy*   Lab Work: BMET 1 week Prior to Follow-Up Appointment If you have labs (blood work) drawn today and your tests are completely normal, you will receive your results only by: [place] (if you have MyChart) OR A paper copy in the mail If you have any lab test that is abnormal or we need to change your treatment, we will call you to review the results.   Testing/Procedures: [facility] [address] Ia Leeb PA-C has ordered a CT coronary calcium score.   Test locations:  [place]   This is $99 out of pocket.   Coronary CalciumScan A coronary calcium scan is an imaging test used to look for deposits of calcium and other fatty materials (plaques) in the inner lining of the blood vessels of the heart (coronary arteries). These deposits of calcium and plaques can partly clog and narrow the coronary arteries without producing any symptoms or warning signs. This puts a person at risk for a heart attack. This test can detect these deposits before symptoms develop. Tell a health care provider about: Any allergies you have. All medicines you are taking, including vitamins, herbs, eye drops, creams, and over-the-counter medicines. Any problems you or family members have had with anesthetic medicines. Any blood disorders you have. Any surgeries you have had. Any medical conditions you have. Whether you are pregnant or may be pregnant. What are the risks? Generally, this is a safe procedure. However, problems may occur, including: Harm to a pregnant woman and her unborn baby. This test involves the use of radiation.  Radiation exposure can be dangerous to a pregnant woman and her unborn baby. If you are pregnant, you generally should not have this procedure done. Slight increase in the risk of cancer. This is because of the radiation involved in the test. What happens before the  procedure? No preparation is needed for this procedure. What happens during the procedure? You will undress and remove any jewelry around your neck or chest. You will put on a hospital gown. Sticky electrodes will be placed on your chest. The electrodes will be connected to an electrocardiogram (ECG) machine to record a tracing of the electrical activity of your heart. A CT scanner will take pictures of your heart. During this time, you will be asked to lie still and hold your breath for 2-3 seconds while a picture of your heart is being taken. The procedure may vary among health care providers and hospitals. What happens after the procedure? You can get dressed. You can return to your normal activities. It is up to you to get the results of your test. Ask your health care provider, or the department that is doing the test, when your results will be ready. Summary A coronary calcium scan is an imaging test used to look for deposits of calcium and other fatty materials (plaques) in the inner lining of the blood vessels of the heart (coronary arteries). Generally, this is a safe procedure. Tell your health care provider if you are pregnant or may be pregnant. No preparation is needed for this procedure. A CT scanner will take pictures of your heart. You can return to your normal activities after the scan is done. This information is not intended to replace advice given to you by your health care provider. Make sure you discuss any questions you have with your health care provider. Document Released: 08/23/2007 Document Revised: 01/14/2016 Document Reviewed: 01/14/2016 Elsevier Interactive Patient Education  2017 [facility]. Your physician has recommended that you have a sleep study. This test records several body functions during sleep, including: brain activity, eye movement, oxygen and carbon dioxide blood levels, heart rate and rhythm, breathing rate and rhythm, the flow of air through your mouth and nose, snoring, body muscle movements, and chest and belly movement.   Follow-Up: At [facility], you and your health needs are our priority.  As part of our continuing mission to provide you with exceptional heart care, we have created designated Provider Care Teams.  These Care Teams include your primary Cardiologist (physician) and Advanced Practice Providers (APPs -  Physician Assistants and Nurse Practitioners) who all work together to provide you with the care you need, when you need it.  We recommend signing up for the patient portal called "MyChart".  Sign up information is provided on this After Visit Summary.  MyChart is used to connect with patients for Virtual Visits (Telemedicine).  Patients are able to view lab/test results, encounter notes, upcoming appointments, etc.  Non-urgent messages can be sent to your provider as well.   To learn more about what you can do with MyChart, go to [email or web address].    Your next appointment:   2 month(s)  Provider:   Caron Presume, PA-C     Other Instructions Bring Blood Pressure Log to Follow up Appointment.    Signed, Warren Lacy, PA-C  04/23/2022 12:28 PM    [facility]

## 2022-04-23 ENCOUNTER — Ambulatory Visit: Payer: BC Managed Care – PPO | Attending: Cardiology | Admitting: Physician Assistant

## 2022-04-23 ENCOUNTER — Encounter: Payer: Self-pay | Admitting: Physician Assistant

## 2022-04-23 VITALS — BP 138/90 | HR 65 | Ht 73.0 in | Wt 323.4 lb

## 2022-04-23 DIAGNOSIS — I10 Essential (primary) hypertension: Secondary | ICD-10-CM

## 2022-04-23 DIAGNOSIS — I420 Dilated cardiomyopathy: Secondary | ICD-10-CM

## 2022-04-23 DIAGNOSIS — R001 Bradycardia, unspecified: Secondary | ICD-10-CM

## 2022-04-23 DIAGNOSIS — E785 Hyperlipidemia, unspecified: Secondary | ICD-10-CM | POA: Diagnosis not present

## 2022-04-23 MED ORDER — ROSUVASTATIN CALCIUM 40 MG PO TABS: 40.0000 mg | ORAL_TABLET | Freq: Every day | ORAL | 3 refills | Status: DC

## 2022-04-23 NOTE — Patient Instructions (Signed)
Medication Instructions:  Stop Spironolactone. Increase Crestor from 10 mg to 40 mg ( Take 1 Tablet Daily). *If you need a refill on your cardiac medications before your next appointment, please call your pharmacy*   Lab Work: BMET 1 week Prior to Follow-Up Appointment If you have labs (blood work) drawn today and your tests are completely normal, you will receive your results only by: [place] (if you have MyChart) OR A paper copy in the mail If you have any lab test that is abnormal or we need to change your treatment, we will call you to review the results.   Testing/Procedures: [facility] [address] Jennifer lambert PA-C has ordered a CT coronary calcium score.   Test locations:  [place]   This is $99 out of pocket.   Coronary CalciumScan A coronary calcium scan is an imaging test used to look for deposits of calcium and other fatty materials (plaques) in the inner lining of the blood vessels of the heart (coronary arteries). These deposits of calcium and plaques can partly clog and narrow the coronary arteries without producing any symptoms or warning signs. This puts a person at risk for a heart attack. This test can detect these deposits before symptoms develop. Tell a health care provider about: Any allergies you have. All medicines you are taking, including vitamins, herbs, eye drops, creams, and over-the-counter medicines. Any problems you or family members have had with anesthetic medicines. Any blood disorders you have. Any surgeries you have had. Any medical conditions you have. Whether you are pregnant or may be pregnant. What are the risks? Generally, this is a safe procedure. However, problems may occur, including: Harm to a pregnant woman and her unborn baby. This test involves the use of radiation. Radiation exposure can be dangerous to a pregnant woman and her unborn baby. If you are pregnant, you generally  should not have this procedure done. Slight increase in the risk of cancer. This is because of the radiation involved in the test. What happens before the procedure? No preparation is needed for this procedure. What happens during the procedure? You will undress and remove any jewelry around your neck or chest. You will put on a hospital gown. Sticky electrodes will be placed on your chest. The electrodes will be connected to an electrocardiogram (ECG) machine to record a tracing of the electrical activity of your heart. A CT scanner will take pictures of your heart. During this time, you will be asked to lie still and hold your breath for 2-3 seconds while a picture of your heart is being taken. The procedure may vary among health care providers and hospitals. What happens after the procedure? You can get dressed. You can return to your normal activities. It is up to you to get the results of your test. Ask your health care provider, or the department that is doing the test, when your results will be ready. Summary A coronary calcium scan is an imaging test used to look for deposits of calcium and other fatty materials (plaques) in the inner lining of the blood vessels of the heart (coronary arteries). Generally, this is a safe procedure. Tell your health care provider if you are pregnant or may be pregnant. No preparation is needed for this procedure. A CT scanner will take pictures of your heart. You can return to your normal activities after the scan is done. This information is not intended to replace advice given to you by your  health care provider. Make sure you discuss any questions you have with your health care provider. Document Released: 08/23/2007 Document Revised: 01/14/2016 Document Reviewed: 01/14/2016 Elsevier Interactive Patient Education  2017 [facility]. Your physician has recommended that you have a sleep study. This test records  several body functions during sleep, including: brain activity, eye movement, oxygen and carbon dioxide blood levels, heart rate and rhythm, breathing rate and rhythm, the flow of air through your mouth and nose, snoring, body muscle movements, and chest and belly movement.   Follow-Up: At [facility], you and your health needs are our priority.  As part of our continuing mission to provide you with exceptional heart care, we have created designated Provider Care Teams.  These Care Teams include your primary Cardiologist (physician) and Advanced Practice Providers (APPs -  Physician Assistants and Nurse Practitioners) who all work together to provide you with the care you need, when you need it.  We recommend signing up for the patient portal called "MyChart".  Sign up information is provided on this After Visit Summary.  MyChart is used to connect with patients for Virtual Visits (Telemedicine).  Patients are able to view lab/test results, encounter notes, upcoming appointments, etc.  Non-urgent messages can be sent to your provider as well.   To learn more about what you can do with MyChart, go to [email or web address].    Your next appointment:   2 month(s)  Provider:   Caron Presume, PA-C     Other Instructions Bring Blood Pressure Log to Follow up Appointment.

## 2022-04-25 ENCOUNTER — Ambulatory Visit: Payer: BC Managed Care – PPO | Admitting: Nurse Practitioner

## 2022-04-25 ENCOUNTER — Encounter: Payer: Self-pay | Admitting: Nurse Practitioner

## 2022-04-25 VITALS — BP 123/78 | HR 65 | Temp 97.9°F | Resp 20 | Ht 73.0 in | Wt 318.0 lb

## 2022-04-25 DIAGNOSIS — L600 Ingrowing nail: Secondary | ICD-10-CM

## 2022-04-25 NOTE — Patient Instructions (Signed)
Ingrown Toenail  An ingrown toenail occurs when the corner or sides of a toenail grow into the surrounding skin. This causes discomfort and pain. The big toe is most commonly affected, but any of the toes can be affected. If an ingrown toenail is not treated, it can become infected. What are the causes? This condition may be caused by: Wearing shoes that are too small or tight. An injury, such as stubbing your toe or having your toe stepped on. Improper cutting or care of your toenails. Having nail or foot abnormalities that were present from birth (congenital abnormalities), such as having a nail that is too big for your toe. What increases the risk? The following factors may make you more likely to develop ingrown toenails: Age. Nails tend to get thicker with age, so ingrown nails are more common among older people. Cutting your toenails incorrectly, such as cutting them very short or cutting them unevenly. An ingrown toenail is more likely to get infected if you have: Diabetes. Blood flow (circulation) problems. What are the signs or symptoms? Symptoms of an ingrown toenail may include: Pain, soreness, or tenderness. Redness. Swelling. Hardening of the skin that surrounds the toenail. Signs that an ingrown toenail may be infected include: Fluid or pus. Symptoms that get worse. How is this diagnosed? Ingrown toenails may be diagnosed based on: Your symptoms and medical history. A physical exam. Labs or tests. If you have fluid or blood coming from your toenail, a sample may be collected to test for the specific type of bacteria that is causing the infection. How is this treated? Treatment depends on the severity of your symptoms. You may be able to care for your toenail at home. If you have an infection, you may be prescribed antibiotic medicines. If you have fluid or pus draining from your toenail, your health care provider may drain it. If you have trouble walking, you may be  given crutches to use. If you have a severe or infected ingrown toenail, you may need a procedure to remove part or all of the nail. Follow these instructions at home: [place] your wound every day for signs of infection, or as often as told by your health care provider. Check for: More redness, swelling, or pain. More fluid or blood. Warmth. Pus or a bad smell. Do not pick at your toenail or try to remove it yourself. Soak your foot in warm, soapy water. Do this for 20 minutes, 3 times a day, or as often as told by your health care provider. This helps to keep your toe clean and your skin soft. Wear shoes that fit well and are not too tight. Your health care provider may recommend that you wear open-toed shoes while you heal. Trim your toenails regularly and carefully. Cut your toenails straight across to prevent injury to the skin at the corners of the toenail. Do not cut your nails in a curved shape. Keep your feet clean and dry to help prevent infection. General instructions Take over-the-counter and prescription medicines only as told by your health care provider. If you were prescribed an antibiotic, take it as told by your health care provider. Do not stop taking the antibiotic even if you start to feel better. If your health care provider told you to use crutches to help you move around, use them as instructed. Return to your normal activities as told by your health care provider. Ask your health care provider what activities are safe for you.  Keep all follow-up visits. This is important. Contact a health care provider if: You have more redness, swelling, pain, or other symptoms that do not improve with treatment. You have fluid, blood, or pus coming from your toenail. You have a red streak on your skin that starts at your foot and spreads up your leg. You have a fever. Summary An ingrown toenail occurs when the corner or sides of a toenail grow into the surrounding skin.  This causes discomfort and pain. The big toe is most commonly affected, but any of the toes can be affected. If an ingrown toenail is not treated, it can become infected. Fluid or pus draining from your toenail is a sign of infection. Your health care provider may need to drain it. You may be given antibiotics to treat the infection. Trimming your toenails regularly and properly can help you prevent an ingrown toenail. This information is not intended to replace advice given to you by your health care provider. Make sure you discuss any questions you have with your health care provider. Document Revised: 06/26/2020 Document Reviewed: 06/26/2020 Elsevier Patient Education  [place].

## 2022-04-25 NOTE — Progress Notes (Signed)
   Subjective:    Patient ID: Ian Stewart, male    DOB: 1971/02/09, 52 y.o.   MRN: [id number]   Chief Complaint: Ingrown Toenail (Both big toes)   HPI Patient comes in today c/o ingrown toe nails. They are stating to hurt him. Has had for several days.  Patient is a diabetic. Do not want to cut on tore if we can help it.  Review of Systems  Constitutional:  Negative for diaphoresis.  Eyes:  Negative for pain.  Respiratory:  Negative for shortness of breath.   Cardiovascular:  Negative for chest pain, palpitations and leg swelling.  Gastrointestinal:  Negative for abdominal pain.  Endocrine: Negative for polydipsia.  Skin:  Negative for rash.  Neurological:  Negative for dizziness, weakness and headaches.  Hematological:  Does not bruise/bleed easily.  All other systems reviewed and are negative.      Objective:   Physical Exam Constitutional:      Appearance: Normal appearance.  Cardiovascular:     Rate and Rhythm: Normal rate and regular rhythm.     Pulses: Normal pulses.     Heart sounds: Normal heart sounds.  Pulmonary:     Effort: Pulmonary effort is normal.     Breath sounds: Normal breath sounds.  Skin:    General: Skin is warm.     Comments: Ingrown toenails bil side of both great toes  Neurological:     General: No focal deficit present.     Mental Status: He is alert and oriented to person, place, and time.  Psychiatric:        Mood and Affect: Mood normal.        Behavior: Behavior normal.     BP 123/78   Pulse 65   Temp 97.9 F (36.6 C) (Temporal)   Resp 20   Ht 6' 1"$  (1.854 m)   Wt (!) 318 lb (144.2 kg)   SpO2 98%   BMI 41.96 kg/m   Toenail wedging performed      Assessment & Plan:   [name] in today with chief complaint of Ingrown Toenail (Both big toes)   1. Ingrown nail of great toe of left foot 2. Ingrown nail of great toe of right foot Wedging toenails demonstrated Change cotton daily after showering RTO if not  improving *Again dont want to cut on toenail due to his diabetes if we dont have to.   The above assessment and management plan was discussed with the patient. The patient verbalized understanding of and has agreed to the management plan. Patient is aware to call the clinic if symptoms persist or worsen. Patient is aware when to return to the clinic for a follow-up visit. Patient educated on when it is appropriate to go to the emergency department.   [name] Done, FNP

## 2022-04-28 ENCOUNTER — Telehealth: Payer: Self-pay | Admitting: *Deleted

## 2022-04-28 NOTE — Telephone Encounter (Signed)
Prior Authorization for HST sent to [facility] via web portal. Per portal no PA is required for sleep management.

## 2022-05-21 ENCOUNTER — Ambulatory Visit (HOSPITAL_BASED_OUTPATIENT_CLINIC_OR_DEPARTMENT_OTHER)
Admission: RE | Admit: 2022-05-21 | Discharge: 2022-05-21 | Disposition: A | Discharge status: Home / Self Care | Payer: BC Managed Care – PPO | Source: Ambulatory Visit | Attending: Physician Assistant | Admitting: Physician Assistant

## 2022-05-21 DIAGNOSIS — E785 Hyperlipidemia, unspecified: Secondary | ICD-10-CM

## 2022-05-21 DIAGNOSIS — R001 Bradycardia, unspecified: Secondary | ICD-10-CM

## 2022-05-21 DIAGNOSIS — I10 Essential (primary) hypertension: Secondary | ICD-10-CM

## 2022-05-21 DIAGNOSIS — I420 Dilated cardiomyopathy: Secondary | ICD-10-CM

## 2022-05-27 ENCOUNTER — Encounter (HOSPITAL_BASED_OUTPATIENT_CLINIC_OR_DEPARTMENT_OTHER): Payer: BC Managed Care – PPO | Admitting: Cardiovascular Disease

## 2022-06-04 ENCOUNTER — Other Ambulatory Visit: Payer: BC Managed Care – PPO

## 2022-06-04 DIAGNOSIS — I420 Dilated cardiomyopathy: Secondary | ICD-10-CM | POA: Diagnosis not present

## 2022-06-04 DIAGNOSIS — E785 Hyperlipidemia, unspecified: Secondary | ICD-10-CM | POA: Diagnosis not present

## 2022-06-04 DIAGNOSIS — R001 Bradycardia, unspecified: Secondary | ICD-10-CM | POA: Diagnosis not present

## 2022-06-04 DIAGNOSIS — I10 Essential (primary) hypertension: Secondary | ICD-10-CM | POA: Diagnosis not present

## 2022-06-05 ENCOUNTER — Telehealth: Payer: Self-pay

## 2022-06-05 LAB — BASIC METABOLIC PANEL
BUN/Creatinine Ratio: 21 — ABNORMAL HIGH (ref 9–20)
BUN: 43 mg/dL — ABNORMAL HIGH (ref 6–24)
CO2: 21 mmol/L (ref 20–29)
Calcium: 9.9 mg/dL (ref 8.7–10.2)
Chloride: 101 mmol/L (ref 96–106)
Creatinine, Ser: 2.08 mg/dL — ABNORMAL HIGH (ref 0.76–1.27)
Glucose: 199 mg/dL — ABNORMAL HIGH (ref 70–99)
Potassium: 4.3 mmol/L (ref 3.5–5.2)
Sodium: 139 mmol/L (ref 134–144)
eGFR: 37 mL/min/{1.73_m2} — ABNORMAL LOW (ref >59)

## 2022-06-05 NOTE — Telephone Encounter (Addendum)
Results viewed by patient via MyChart----- Message from Warren Lacy, PA-C sent at 05/28/2022  8:41 AM EDT ----- Coronary calcium score of 491 Agatston units. This was 99th percentile for age-, race-, and sex-matched controls.  Recommendations -Risk reduction critical  -Weight loss recommended. -Follow-up sleep study. -Check lipids week prior to next appt.  Goal LDL <70. -Continue aspirin 81mg  daily. -Will discuss possible stress testing at next appt to further define extent of coronary disease.

## 2022-06-05 NOTE — Telephone Encounter (Signed)
-----   Message from Warren Lacy, PA-C sent at 05/28/2022  8:41 AM EDT ----- Coronary calcium score of 491 Agatston units. This was 99th percentile for age-, race-, and sex-matched controls.  Recommendations -Risk reduction critical  -Weight loss recommended. -Follow-up sleep study. -Check lipids week prior to next appt.  Goal LDL <70. -Continue aspirin 81mg  daily. -Will discuss possible stress testing at next appt to further define extent of coronary disease.

## 2022-06-05 NOTE — Telephone Encounter (Signed)
Results viewed by patient via [place].

## 2022-06-10 NOTE — Progress Notes (Unsigned)
Cardiology Office Note:    Date:  06/11/2022   ID:  Gillermo Paller, DOB [birth year], MRN [id number]  PCP:  Chevis Pretty, [place] Cardiologist: Minus Breeding, MD   Reason for visit: 2 month follow-up  History of Present Illness:    Tony Schubring is a 52 y.o. male with a hx of hypertension, hyperlipidemia, bradycardia cardiomyopathy, CKD, DM (A1C 6.5% in 03/2022).   I saw him in February 2024.  He mentioned concerns from his PCP about increasing creatinine of 2.2 and elevated cholesterol.  Patient denied chest pain but had persistent shortness of breath with significant exertion.  Today, patient states he is doing okay.  He states he did not get home sleep study done because it was clinical $600.  His blood pressure is well-controlled off spironolactone.  He has continued to lose weight with diet changes.  He declined referral to the [facility] for healthy weight and wellness.  He works as a Hotel manager.  He denies chest pain.  He has shortness of breath with moderate exertion, for example going up a flight of stairs.  He denies PND, orthopnea and lightheadedness.  He has minimal pedal edema that he blames on amlodipine.      Past Medical History:  Diagnosis Date   Arrhythmia    BRADYCARDIA/ 41-50 BEATS   Chronic kidney disease    monitoring kidey function due to other medications   Hyperlipidemia    Hypertension     Past Surgical History:  Procedure Laterality Date   RETINAL DETACHMENT SURGERY      Current Medications: Current Meds  Medication Sig   acetaminophen (TYLENOL) 500 MG tablet Take 1,000 mg by mouth every 6 (six) hours as needed for mild pain or moderate pain.   amLODipine (NORVASC) 10 MG tablet Take 1 tablet (10 mg total) by mouth daily.   Ascorbic Acid (VITAMIN C) 500 MG CAPS Take by mouth.   aspirin 81 MG tablet Take 81 mg by mouth daily.   Cholecalciferol (DIALYVITE VITAMIN D 5000 PO) Take by mouth.   Coenzyme Q10 (CO Q 10 PO) Take by  mouth.   colchicine 0.6 MG tablet 2 tablets PO at gout onswet, may repeat 1x in 1 hour. No more then 3 per day.   fenofibrate (TRICOR) 145 MG tablet Take 1 tablet (145 mg total) by mouth daily.   Ferrous Sulfate 50 MG TBCR Take by mouth.   fluticasone (FLONASE) 50 MCG/ACT nasal spray Place 2 sprays into both nostrils every morning.    metoprolol succinate (TOPROL-XL) 100 MG 24 hr tablet TAKE 1 TABLET BY MOUTH DAILY  TAKE WITH OR IMMEDIATELY  FOLLOWING A MEAL IN THE MORNING   Multiple Vitamin (MULTIVITAMIN) capsule Take 1 capsule by mouth every morning.    Multiple Vitamins-Minerals (ZINC PO) Take by mouth.   rosuvastatin (CRESTOR) 40 MG tablet Take 1 tablet (40 mg total) by mouth daily.   valsartan-hydrochlorothiazide (DIOVAN-HCT) 320-25 MG tablet Take 1 tablet by mouth daily.     Allergies:   Lipitor [atorvastatin]   Social History   Socioeconomic History   Marital status: Single    Spouse name: Not on file   Number of children: Not on file   Years of education: Not on file   Highest education level: Not on file  Occupational History   Not on file  Tobacco Use   Smoking status: Never   Smokeless tobacco: Never  Vaping Use   Vaping Use: Never used  Substance and Sexual  Activity   Alcohol use: No   Drug use: No   Sexual activity: Not on file  Other Topics Concern   Not on file  Social History Narrative   Works at [employer] and lives alone.     Social Determinants of Health   Financial Resource Strain: Not on file  Food Insecurity: Not on file  Transportation Needs: Not on file  Physical Activity: Not on file  Stress: Not on file  Social Connections: Not on file     Family History: The patient's family history includes CAD (age of onset: 53) in his father; CAD (age of onset: 38) in his mother. There is no history of Colon cancer, Esophageal cancer, Stomach cancer, or Rectal cancer.  ROS:   Please see the history of present illness.     EKGs/Labs/Other Studies Reviewed:     Recent Labs: 03/19/2022: ALT 28; Hemoglobin 13.5; Platelets 268 06/04/2022: BUN 43; Creatinine, Ser 2.08; Potassium 4.3; Sodium 139   Recent Lipid Panel Lab Results  Component Value Date/Time   CHOL 217 (H) 03/19/2022 08:51 AM   TRIG 481 (H) 03/19/2022 08:51 AM   TRIG 263 (H) 07/20/2014 02:50 PM   HDL 22 (L) 03/19/2022 08:51 AM   HDL 37 (L) 07/20/2014 02:50 PM   LDLCALC 111 (H) 03/19/2022 08:51 AM    Physical Exam:    VS:  BP 128/84   Pulse 73   Ht 6\' 1"  (1.854 m)   Wt (!) 309 lb 12.8 oz (140.5 kg)   SpO2 96%   BMI 40.87 kg/m    No data found.   Wt Readings from Last 3 Encounters:  06/11/22 (!) 309 lb 12.8 oz (140.5 kg)  04/25/22 (!) 318 lb (144.2 kg)  04/23/22 (!) 323 lb 6.4 oz (146.7 kg)     GEN:  Well nourished, well developed in no acute distress, obese HEENT: Normal NECK: No JVD; No carotid bruits CARDIAC: RRR, no murmurs, rubs, gallops RESPIRATORY:  Clear to auscultation without rales, wheezing or rhonchi  ABDOMEN: Soft, non-tender, non-distended MUSCULOSKELETAL: Trace pedal edema SKIN: Warm and dry NEUROLOGIC:  Alert and oriented PSYCHIATRIC:  Normal affect     ASSESSMENT AND PLAN   Elevated coronary calcium score -Coronary calcium score of 491 Agatston units. This was 99th percentile for age-, race-, and sex-matched controls. -Weight loss recommended.  Follow-up lipid control. -Continue aspirin 81mg  daily. -Recommend cardiac PET to assess for ischemia.  Patient has dyspnea on exertion.  Nonischemic cardiomyopathy, euvolemic -Echo 2021: EF 45-50%, Grade I DD, moderately enlarged RV, mod dilated atria, no obvious valve disease -Spironolactone discontinued in February 2024 secondary to CKD. -Continue Diovan HCTZ.  Baseline creatinine around 2.   -Continue Toprol 100 mg daily. -Did not have home sleep study due to secondary to high cost.   Bradycardia, stable -In the past he has had heart rates in the 40s at night.  Heart rate 60s to 70s at his office  visits. -No lightheadedness and syncope.  Continue Toprol.   Hypertension, well-controlled -Continue current medications. -Goal BP is <130/80.  Recommend DASH diet (high in vegetables, fruits, low-fat dairy products, whole grains, poultry, fish, and nuts and low in sweets, sugar-sweetened beverages, and red meats), salt restriction and increase physical activity.   Hyperlipidemia goal LDL less than 70 -Lipids 03/2022: Total 217, HDL 22, Trig 481, LDL 111.  Crestor increased to 40 mg in February 2024. -With elevated coronary calcium score, recommend LDL goal less than 70. -Check fasting lipids in 1 to 2  months. -If LDL remains above 70, will consider adding Zetia. -Discussed cholesterol lowering diets - Mediterranean diet, DASH diet, vegetarian diet, low-carbohydrate diet and avoidance of trans fats.  Discussed healthier choice substitutes.  Nuts, high-fiber foods, and fiber supplements may also improve lipids.     Obesity -Continue to recommend weight loss, healthy diet and regular activity to reduce cardiovascular risk. -Declined referral to the Center for healthy weight and wellness.   Disposition - Follow-up in 3 to 4 months with Dr. Percival Spanish or myself.  Will make a sooner appointment if stress testing positive for significant ischemia.   Shared Decision Making/Informed Consent The risks [chest pain, shortness of breath, cardiac arrhythmias, dizziness, blood pressure fluctuations, myocardial infarction, stroke/transient ischemic attack, nausea, vomiting, allergic reaction, radiation exposure, metallic taste sensation and life-threatening complications (estimated to be 1 in 10,000)], benefits (risk stratification, diagnosing coronary artery disease, treatment guidance) and alternatives of a cardiac PET stress test were discussed in detail with Mr. Seidner and he agrees to proceed.    Medication Adjustments/Labs and Tests Ordered: Current medicines are reviewed at length with the patient  today.  Concerns regarding medicines are outlined above.  Orders Placed This Encounter  Procedures   NM PET CT CARDIAC PERFUSION MULTI W/ABSOLUTE BLOODFLOW   Lipid panel   No orders of the defined types were placed in this encounter.   Patient Instructions  Medication Instructions:  No Changes *If you need a refill on your cardiac medications before your next appointment, please call your pharmacy*   Lab Work: Lipid Panel 1-2 Months If you have labs (blood work) drawn today and your tests are completely normal, you will receive your results only by: [place] (if you have MyChart) OR A paper copy in the mail If you have any lab test that is abnormal or we need to change your treatment, we will call you to review the results.   Testing/Procedures: How to Prepare for Your Cardiac PET/CT Stress Test:  1. Please do not take these medications before your test:   Medications that may interfere with the cardiac pharmacological stress agent (ex. nitrates - including erectile dysfunction medications, isosorbide mononitrate, tamulosin or beta-blockers) the day of the exam. (Erectile dysfunction medication should be held for at least 72 hrs prior to test) Theophylline containing medications for 12 hours. Dipyridamole 48 hours prior to the test. Your remaining medications may be taken with water.  2. Nothing to eat or drink, except water, 3 hours prior to arrival time.   NO caffeine/decaffeinated products, or chocolate 12 hours prior to arrival.  3. NO perfume, cologne or lotion  4. Total time is 1 to 2 hours; you may want to bring reading material for the waiting time.  5. Please report to Radiology at the [facility] Main Entrance 30 minutes early for your test.  [place], [place] [address]  Diabetic Preparation:  Hold oral medications. You may take NPH and Lantus insulin. Do not take Humalog or Humulin R (Regular Insulin) the day of your  test. Check blood sugars prior to leaving the house. If able to eat breakfast prior to 3 hour fasting, you may take all medications, including your insulin, Do not worry if you miss your breakfast dose of insulin - start at your next meal.  IF YOU THINK YOU MAY BE PREGNANT, OR ARE NURSING PLEASE INFORM THE TECHNOLOGIST.  In preparation for your appointment, medication and supplies will be purchased.  Appointment availability is limited, so if  you need to cancel or reschedule, please call the Radiology Department at [phone]  24 hours in advance to avoid a cancellation fee of $100.00  What to Expect After you Arrive:  Once you arrive and check in for your appointment, you will be taken to a preparation room within the Radiology Department.  A technologist or Nurse will obtain your medical history, verify that you are correctly prepped for the exam, and explain the procedure.  Afterwards,  an IV will be started in your arm and electrodes will be placed on your skin for EKG monitoring during the stress portion of the exam. Then you will be escorted to the PET/CT scanner.  There, staff will get you positioned on the scanner and obtain a blood pressure and EKG.  During the exam, you will continue to be connected to the EKG and blood pressure machines.  A small, safe amount of a radioactive tracer will be injected in your IV to obtain a series of pictures of your heart along with an injection of a stress agent.    After your Exam:  It is recommended that you eat a meal and drink a caffeinated beverage to counter act any effects of the stress agent.  Drink plenty of fluids for the remainder of the day and urinate frequently for the first couple of hours after the exam.  Your doctor will inform you of your test results within 7-10 business days.  For questions about your test or how to prepare for your test, please call: Marchia Bond, Cardiac Imaging Nurse Navigator  Gordy Clement, Cardiac Imaging  Nurse Navigator Office: [phone]    Follow-Up: At [facility], you and your health needs are our priority.  As part of our continuing mission to provide you with exceptional heart care, we have created designated Provider Care Teams.  These Care Teams include your primary Cardiologist (physician) and Advanced Practice Providers (APPs -  Physician Assistants and Nurse Practitioners) who all work together to provide you with the care you need, when you need it.  We recommend signing up for the patient portal called "MyChart".  Sign up information is provided on this After Visit Summary.  MyChart is used to connect with patients for Virtual Visits (Telemedicine).  Patients are able to view lab/test results, encounter notes, upcoming appointments, etc.  Non-urgent messages can be sent to your provider as well.   To learn more about what you can do with MyChart, go to [email or web address].    Your next appointment:   3-4 month(s)  Provider:   Caron Presume, PA-C  or, Minus Breeding, MD    Signed, Warren Lacy, PA-C  06/11/2022 9:37 AM    [place]

## 2022-06-11 ENCOUNTER — Encounter: Payer: Self-pay | Admitting: Physician Assistant

## 2022-06-11 ENCOUNTER — Ambulatory Visit: Payer: BC Managed Care – PPO | Attending: Physician Assistant | Admitting: Physician Assistant

## 2022-06-11 ENCOUNTER — Telehealth: Payer: Self-pay

## 2022-06-11 VITALS — BP 128/84 | HR 73 | Ht 73.0 in | Wt 309.8 lb

## 2022-06-11 DIAGNOSIS — R001 Bradycardia, unspecified: Secondary | ICD-10-CM | POA: Diagnosis not present

## 2022-06-11 DIAGNOSIS — R931 Abnormal findings on diagnostic imaging of heart and coronary circulation: Secondary | ICD-10-CM | POA: Diagnosis not present

## 2022-06-11 DIAGNOSIS — I420 Dilated cardiomyopathy: Secondary | ICD-10-CM | POA: Diagnosis not present

## 2022-06-11 DIAGNOSIS — I10 Essential (primary) hypertension: Secondary | ICD-10-CM

## 2022-06-11 DIAGNOSIS — E785 Hyperlipidemia, unspecified: Secondary | ICD-10-CM | POA: Diagnosis not present

## 2022-06-11 NOTE — Patient Instructions (Signed)
Medication Instructions:  No Changes *If you need a refill on your cardiac medications before your next appointment, please call your pharmacy*   Lab Work: Lipid Panel 1-2 Months If you have labs (blood work) drawn today and your tests are completely normal, you will receive your results only by: [place] (if you have MyChart) OR A paper copy in the mail If you have any lab test that is abnormal or we need to change your treatment, we will call you to review the results.   Testing/Procedures: How to Prepare for Your Cardiac PET/CT Stress Test:  1. Please do not take these medications before your test:   Medications that may interfere with the cardiac pharmacological stress agent (ex. nitrates - including erectile dysfunction medications, isosorbide mononitrate, tamulosin or beta-blockers) the day of the exam. (Erectile dysfunction medication should be held for at least 72 hrs prior to test) Theophylline containing medications for 12 hours. Dipyridamole 48 hours prior to the test. Your remaining medications may be taken with water.  2. Nothing to eat or drink, except water, 3 hours prior to arrival time.   NO caffeine/decaffeinated products, or chocolate 12 hours prior to arrival.  3. NO perfume, cologne or lotion  4. Total time is 1 to 2 hours; you may want to bring reading material for the waiting time.  5. Please report to Radiology at the [facility] Main Entrance 30 minutes early for your test.  [place], [place] [address]  Diabetic Preparation:  Hold oral medications. You may take NPH and Lantus insulin. Do not take Humalog or Humulin R (Regular Insulin) the day of your test. Check blood sugars prior to leaving the house. If able to eat breakfast prior to 3 hour fasting, you may take all medications, including your insulin, Do not worry if you miss your breakfast dose of insulin - start at your next meal.  IF YOU THINK YOU MAY BE  PREGNANT, OR ARE NURSING PLEASE INFORM THE TECHNOLOGIST.  In preparation for your appointment, medication and supplies will be purchased.  Appointment availability is limited, so if you need to cancel or reschedule, please call the Radiology Department at [phone]  24 hours in advance to avoid a cancellation fee of $100.00  What to Expect After you Arrive:  Once you arrive and check in for your appointment, you will be taken to a preparation room within the Radiology Department.  A technologist or Nurse will obtain your medical history, verify that you are correctly prepped for the exam, and explain the procedure.  Afterwards,  an IV will be started in your arm and electrodes will be placed on your skin for EKG monitoring during the stress portion of the exam. Then you will be escorted to the PET/CT scanner.  There, staff will get you positioned on the scanner and obtain a blood pressure and EKG.  During the exam, you will continue to be connected to the EKG and blood pressure machines.  A small, safe amount of a radioactive tracer will be injected in your IV to obtain a series of pictures of your heart along with an injection of a stress agent.    After your Exam:  It is recommended that you eat a meal and drink a caffeinated beverage to counter act any effects of the stress agent.  Drink plenty of fluids for the remainder of the day and urinate frequently for the first couple of hours after the exam.  Your doctor will  inform you of your test results within 7-10 business days.  For questions about your test or how to prepare for your test, please call: Marchia Bond, Cardiac Imaging Nurse Navigator  Gordy Clement, Cardiac Imaging Nurse Navigator Office: [phone]    Follow-Up: At [facility], you and your health needs are our priority.  As part of our continuing mission to provide you with exceptional heart care, we have created designated Provider Care Teams.  These Care Teams  include your primary Cardiologist (physician) and Advanced Practice Providers (APPs -  Physician Assistants and Nurse Practitioners) who all work together to provide you with the care you need, when you need it.  We recommend signing up for the patient portal called "MyChart".  Sign up information is provided on this After Visit Summary.  MyChart is used to connect with patients for Virtual Visits (Telemedicine).  Patients are able to view lab/test results, encounter notes, upcoming appointments, etc.  Non-urgent messages can be sent to your provider as well.   To learn more about what you can do with MyChart, go to [email or web address].    Your next appointment:   3-4 month(s)  Provider:   Caron Presume, PA-C  or, Minus Breeding, MD

## 2022-06-11 NOTE — Telephone Encounter (Addendum)
Results review with patient during office visit on 06/11/22----- Message from Warren Lacy, PA-C sent at 06/10/2022  9:25 PM EDT ----- Seeing pt 4/3 in clinic.   Creatinine slightly improved from January - now 2.08; overall stable over the last 2 years.  Normal potassium.

## 2022-08-06 ENCOUNTER — Other Ambulatory Visit: Payer: Self-pay | Admitting: Nurse Practitioner

## 2022-08-06 DIAGNOSIS — I10 Essential (primary) hypertension: Secondary | ICD-10-CM

## 2022-08-08 ENCOUNTER — Telehealth (HOSPITAL_COMMUNITY): Payer: Self-pay | Admitting: Emergency Medicine

## 2022-08-08 DIAGNOSIS — I2510 Atherosclerotic heart disease of native coronary artery without angina pectoris: Secondary | ICD-10-CM

## 2022-08-11 ENCOUNTER — Telehealth (HOSPITAL_COMMUNITY): Payer: Self-pay | Admitting: Emergency Medicine

## 2022-08-11 NOTE — Telephone Encounter (Signed)
Cardiac PET stress attestation signed by Damian Leavell RN Navigator Cardiac Imaging [facility] [phone] Office  [phone] Cell

## 2022-08-12 NOTE — Telephone Encounter (Signed)
Reaching out to patient to offer assistance regarding upcoming cardiac imaging study; pt verbalizes understanding of appt date/time, parking situation and where to check in, pre-test NPO status and medications ordered, and verified current allergies; name and call back number provided for further questions should they arise Izrael Peak RN Navigator Cardiac Imaging [facility] [phone] office [phone] cell 

## 2022-08-13 ENCOUNTER — Ambulatory Visit (HOSPITAL_COMMUNITY): Payer: BC Managed Care – PPO

## 2022-08-20 ENCOUNTER — Other Ambulatory Visit: Payer: BC Managed Care – PPO

## 2022-08-20 DIAGNOSIS — I10 Essential (primary) hypertension: Secondary | ICD-10-CM | POA: Diagnosis not present

## 2022-08-20 DIAGNOSIS — I420 Dilated cardiomyopathy: Secondary | ICD-10-CM | POA: Diagnosis not present

## 2022-08-20 DIAGNOSIS — R001 Bradycardia, unspecified: Secondary | ICD-10-CM | POA: Diagnosis not present

## 2022-08-20 DIAGNOSIS — E785 Hyperlipidemia, unspecified: Secondary | ICD-10-CM | POA: Diagnosis not present

## 2022-08-21 LAB — LIPID PANEL
Chol/HDL Ratio: 4.2 ratio (ref 0.0–5.0)
Cholesterol, Total: 125 mg/dL (ref 100–199)
HDL: 30 mg/dL — ABNORMAL LOW (ref >39)
LDL Chol Calc (NIH): 62 mg/dL (ref 0–99)
Triglycerides: 201 mg/dL — ABNORMAL HIGH (ref 0–149)
VLDL Cholesterol Cal: 33 mg/dL (ref 5–40)

## 2022-08-27 ENCOUNTER — Telehealth: Payer: Self-pay

## 2022-08-27 NOTE — Telephone Encounter (Addendum)
Called patient regarding results. Patient had understanding of results.----- Message from Cannon Kettle, PA-C sent at 08/26/2022 10:22 PM EDT ----- Your LDL (bad cholesterol) is at goal!  It is <70.  Congratulations.  Triglycerides and HDL are also improved.    Continue current medications.   Cholesterol lowering diets include Mediterranean diet, DASH diet, vegetarian diet, low-carbohydrate diet and avoidance of trans fats.  Discussed healthier choice substitutes.  Nuts, high-fiber foods, and fiber supplements may also improve lipids.

## 2022-09-08 NOTE — Progress Notes (Unsigned)
  Cardiology Office Note   Date:  09/10/2022  ID:  Ian Stewart, DOB 31-Jan-1971, MRN [id number] PCP:  Bennie Pierini, FNP [facility] Cardiologist: Rollene Rotunda, MD  Reason for visit: 43-month follow-up  History of Present Illness    Ian Stewart is a 52 y.o. male with a hx of hypertension, hyperlipidemia, bradycardia, cardiomyopathy, CKD, DM (A1C 6.5% in 03/2022).   I last saw him in April 2024.  Patient mentions shortness of breath with moderate exertion, for example going up a flight of stairs.  With 99 percentile on his coronary calcium score cardiac PET was ordered.  Today, patient explains that he did not proceed with cardiac PET due to $2000 co-pay.  He works as a Brewing technologist in an office.  He tries to walk a couple miles per day.  On level ground, he denies shortness of breath.  He denies chest pain, PND, orthopnea, lower extremity edema, palpitations, lightheadedness and syncope.  He denies issues with his medications.  He states he states he sees his PCP next week.  He has a new patient appointment with nephrology in July.  Elevated coronary calcium score -Coronary calcium score 05/2022: 491 Agatston units. This was 99th percentile for age-, race-, and sex-matched controls.   -Cardiac PET not done due to high co-pay.  Will continue risk factor modification.   Objective / Physical Exam   Vital signs:  BP 128/76   Pulse 62   Ht 6\' 1"  (1.854 m)   Wt (!) 312 lb 12.8 oz (141.9 kg)   SpO2 97%   BMI 41.27 kg/m     GEN: No acute distress NECK: No carotid bruits CARDIAC: RRR, no murmurs RESPIRATORY:  Clear to auscultation without rales, wheezing or rhonchi  EXTREMITIES: No edema  Assessment and Plan    -Blood pressure & lipids well-controlled. -Again stressed need for weight.  Discussed his diagnosis of diabetes with a hemoglobin A1c 6.5%.  Given information on diabetic diet and healthy lifestyle today. -Continue aspirin 81mg  daily.    Nonischemic cardiomyopathy, euvolemic -Echo 2021: EF 45-50%, Grade I DD, moderately enlarged RV, mod dilated atria, no obvious valve disease -Cr 2.08 in March 2024 (b/l 1.9-2.2).  He has a new patient appointment with nephrology in July. -Spironolactone discontinued in February 2024 secondary to CKD. -Continue Diovan-HCTZ.   -Continue Toprol 100 mg daily. -Did not have home sleep study due to secondary to high cost.   Hypertension, well-controlled -Continue current medications. -Goal BP is <130/80.  Recommend DASH diet (high in vegetables, fruits, low-fat dairy products, whole grains, poultry, fish, and nuts and low in sweets, sugar-sweetened beverages, and red meats), salt restriction and increase physical activity.   Hyperlipidemia goal LDL less than 70 -Lipids June 2024: LDL 62 on Crestor 40 mg daily.  -Continue Crestor.  Obesity -[facility]   Disposition - Follow-up in 6 months with Dr. Antoine Poche or myself.   Signed, Cannon Kettle, PA-C  09/10/2022 [facility]

## 2022-09-10 ENCOUNTER — Encounter: Payer: Self-pay | Admitting: Physician Assistant

## 2022-09-10 ENCOUNTER — Ambulatory Visit: Payer: BC Managed Care – PPO | Attending: Physician Assistant | Admitting: Physician Assistant

## 2022-09-10 VITALS — BP 128/76 | HR 62 | Ht 73.0 in | Wt 312.8 lb

## 2022-09-10 DIAGNOSIS — E785 Hyperlipidemia, unspecified: Secondary | ICD-10-CM

## 2022-09-10 DIAGNOSIS — I10 Essential (primary) hypertension: Secondary | ICD-10-CM

## 2022-09-10 DIAGNOSIS — R931 Abnormal findings on diagnostic imaging of heart and coronary circulation: Secondary | ICD-10-CM

## 2022-09-10 DIAGNOSIS — I420 Dilated cardiomyopathy: Secondary | ICD-10-CM | POA: Diagnosis not present

## 2022-09-10 NOTE — Patient Instructions (Signed)
Medication Instructions:  Your physician recommends that you continue on your current medications as directed. Please refer to the Current Medication list given to you today.  *If you need a refill on your cardiac medications before your next appointment, please call your pharmacy*   Lab Work: None   Testing/Procedures: None   Follow-Up: At [facility], you and your health needs are our priority.  As part of our continuing mission to provide you with exceptional heart care, we have created designated Provider Care Teams.  These Care Teams include your primary Cardiologist (physician) and Advanced Practice Providers (APPs -  Physician Assistants and Nurse Practitioners) who all work together to provide you with the care you need, when you need it.    Your next appointment:   6 month(s)  Provider:   Rollene Rotunda, MD     Other Instructions Diabetes Mellitus and Nutrition, Adult When you have diabetes, or diabetes mellitus, it is very important to have healthy eating habits because your blood sugar (glucose) levels are greatly affected by what you eat and drink. Eating healthy foods in the right amounts, at about the same times every day, can help you: Manage your blood glucose. Lower your risk of heart disease. Improve your blood pressure. Reach or maintain a healthy weight. What can affect my meal plan? Every person with diabetes is different, and each person has different needs for a meal plan. Your health care provider may recommend that you work with a dietitian to make a meal plan that is best for you. Your meal plan may vary depending on factors such as: The calories you need. The medicines you take. Your weight. Your blood glucose, blood pressure, and cholesterol levels. Your activity level. Other health conditions you have, such as heart or kidney disease. How do carbohydrates affect me? Carbohydrates, also called carbs, affect your blood glucose level more  than any other type of food. Eating carbs raises the amount of glucose in your blood. It is important to know how many carbs you can safely have in each meal. This is different for every person. Your dietitian can help you calculate how many carbs you should have at each meal and for each snack. How does alcohol affect me? Alcohol can cause a decrease in blood glucose (hypoglycemia), especially if you use insulin or take certain diabetes medicines by mouth. Hypoglycemia can be a life-threatening condition. Symptoms of hypoglycemia, such as sleepiness, dizziness, and confusion, are similar to symptoms of having too much alcohol. Do not drink alcohol if: Your health care provider tells you not to drink. You are pregnant, may be pregnant, or are planning to become pregnant. If you drink alcohol: Limit how much you have to: 0-1 drink a day for women. 0-2 drinks a day for men. Know how much alcohol is in your drink. In the [place] one drink equals one 12 oz bottle of beer (355 mL), one 5 oz glass of wine (148 mL), or one 1 oz glass of hard liquor (44 mL). Keep yourself hydrated with water, diet soda, or unsweetened iced tea. Keep in mind that regular soda, juice, and other mixers may contain a lot of sugar and must be counted as carbs. What are tips for following this plan?  Reading food labels Start by checking the serving size on the Nutrition Facts label of packaged foods and drinks. The number of calories and the amount of carbs, fats, and other nutrients listed on the label are based on one serving  of the item. Many items contain more than one serving per package. Check the total grams (g) of carbs in one serving. Check the number of grams of saturated fats and trans fats in one serving. Choose foods that have a low amount or none of these fats. Check the number of milligrams (mg) of salt (sodium) in one serving. Most people should limit total sodium intake to less than 2,300 mg per day. Always  check the nutrition information of foods labeled as "low-fat" or "nonfat." These foods may be higher in added sugar or refined carbs and should be avoided. Talk to your dietitian to identify your daily goals for nutrients listed on the label. Shopping Avoid buying canned, pre-made, or processed foods. These foods tend to be high in fat, sodium, and added sugar. Shop around the outside edge of the grocery store. This is where you will most often find fresh fruits and vegetables, bulk grains, fresh meats, and fresh dairy products. Cooking Use low-heat cooking methods, such as baking, instead of high-heat cooking methods, such as deep frying. Cook using healthy oils, such as olive, canola, or sunflower oil. Avoid cooking with butter, cream, or high-fat meats. Meal planning Eat meals and snacks regularly, preferably at the same times every day. Avoid going long periods of time without eating. Eat foods that are high in fiber, such as fresh fruits, vegetables, beans, and whole grains. Eat 4-6 oz (112-168 g) of lean protein each day, such as lean meat, chicken, fish, eggs, or tofu. One ounce (oz) (28 g) of lean protein is equal to: 1 oz (28 g) of meat, chicken, or fish. 1 egg.  cup (62 g) of tofu. Eat some foods each day that contain healthy fats, such as avocado, nuts, seeds, and fish. What foods should I eat? Fruits Berries. Apples. Oranges. Peaches. Apricots. Plums. Grapes. Mangoes. Papayas. Pomegranates. Kiwi. Cherries. Vegetables Leafy greens, including lettuce, spinach, kale, chard, collard greens, mustard greens, and cabbage. Beets. Cauliflower. Broccoli. Carrots. Green beans. Tomatoes. Peppers. Onions. Cucumbers. Brussels sprouts. Grains Whole grains, such as whole-wheat or whole-grain bread, crackers, tortillas, cereal, and pasta. Unsweetened oatmeal. Quinoa. Brown or wild rice. Meats and other proteins Seafood. Poultry without skin. Lean cuts of poultry and beef. Tofu. Nuts.  Seeds. Dairy Low-fat or fat-free dairy products such as milk, yogurt, and cheese. The items listed above may not be a complete list of foods and beverages you can eat and drink. Contact a dietitian for more information. What foods should I avoid? Fruits Fruits canned with syrup. Vegetables Canned vegetables. Frozen vegetables with butter or cream sauce. Grains Refined white flour and flour products such as bread, pasta, snack foods, and cereals. Avoid all processed foods. Meats and other proteins Fatty cuts of meat. Poultry with skin. Breaded or fried meats. Processed meat. Avoid saturated fats. Dairy Full-fat yogurt, cheese, or milk. Beverages Sweetened drinks, such as soda or iced tea. The items listed above may not be a complete list of foods and beverages you should avoid. Contact a dietitian for more information. Questions to ask a health care provider Do I need to meet with a certified diabetes care and education specialist? Do I need to meet with a dietitian? What number can I call if I have questions? When are the best times to check my blood glucose? Where to find more information: American Diabetes Association: diabetes.org Academy of Nutrition and Dietetics: eatright.[employer] of Diabetes and Digestive and Kidney Diseases: StageSync.si Association of Diabetes Care & Education Specialists: diabeteseducator.org  Summary It is important to have healthy eating habits because your blood sugar (glucose) levels are greatly affected by what you eat and drink. It is important to use alcohol carefully. A healthy meal plan will help you manage your blood glucose and lower your risk of heart disease. Your health care provider may recommend that you work with a dietitian to make a meal plan that is best for you. This information is not intended to replace advice given to you by your health care provider. Make sure you discuss any questions you have with your health care  provider. Document Revised: 09/28/2019 Document Reviewed: 09/28/2019 Elsevier Patient Education  2024 [employer].

## 2022-09-18 ENCOUNTER — Ambulatory Visit: Payer: BC Managed Care – PPO | Admitting: Nurse Practitioner

## 2022-09-18 ENCOUNTER — Encounter: Payer: Self-pay | Admitting: Nurse Practitioner

## 2022-09-18 VITALS — BP 132/81 | HR 55 | Temp 97.9°F | Resp 20 | Ht 73.0 in | Wt 311.0 lb

## 2022-09-18 DIAGNOSIS — E8881 Metabolic syndrome: Secondary | ICD-10-CM | POA: Diagnosis not present

## 2022-09-18 DIAGNOSIS — E785 Hyperlipidemia, unspecified: Secondary | ICD-10-CM | POA: Diagnosis not present

## 2022-09-18 DIAGNOSIS — N1831 Chronic kidney disease, stage 3a: Secondary | ICD-10-CM | POA: Diagnosis not present

## 2022-09-18 DIAGNOSIS — R7309 Other abnormal glucose: Secondary | ICD-10-CM | POA: Diagnosis not present

## 2022-09-18 DIAGNOSIS — I10 Essential (primary) hypertension: Secondary | ICD-10-CM | POA: Diagnosis not present

## 2022-09-18 DIAGNOSIS — I129 Hypertensive chronic kidney disease with stage 1 through stage 4 chronic kidney disease, or unspecified chronic kidney disease: Secondary | ICD-10-CM | POA: Diagnosis not present

## 2022-09-18 DIAGNOSIS — R001 Bradycardia, unspecified: Secondary | ICD-10-CM

## 2022-09-18 DIAGNOSIS — Z125 Encounter for screening for malignant neoplasm of prostate: Secondary | ICD-10-CM

## 2022-09-18 DIAGNOSIS — Z6841 Body Mass Index (BMI) 40.0 and over, adult: Secondary | ICD-10-CM

## 2022-09-18 LAB — BAYER DCA HB A1C WAIVED: HB A1C (BAYER DCA - WAIVED): 6.4 % — ABNORMAL HIGH (ref 4.8–5.6)

## 2022-09-18 MED ORDER — METOPROLOL SUCCINATE ER 100 MG PO TB24
ORAL_TABLET | ORAL | 1 refills | Status: DC
Start: 2022-09-18 — End: 2023-03-12

## 2022-09-18 MED ORDER — FENOFIBRATE 145 MG PO TABS
145.0000 mg | ORAL_TABLET | Freq: Every day | ORAL | 1 refills | Status: DC
Start: 2022-09-18 — End: 2023-03-12

## 2022-09-18 MED ORDER — VALSARTAN-HYDROCHLOROTHIAZIDE 320-25 MG PO TABS
1.0000 | ORAL_TABLET | Freq: Every day | ORAL | 1 refills | Status: DC
Start: 2022-09-18 — End: 2023-03-25

## 2022-09-18 MED ORDER — AMLODIPINE BESYLATE 10 MG PO TABS
10.0000 mg | ORAL_TABLET | Freq: Every day | ORAL | 1 refills | Status: DC
Start: 2022-09-18 — End: 2023-03-30

## 2022-09-18 NOTE — Progress Notes (Signed)
Subjective:    Patient ID: Ian Stewart, male    DOB: [birth year], 52 y.o.   MRN: [id number]   Chief Complaint: medical management of chronic issues     HPI:  Ian Stewart is a 52 y.o. who identifies as a male who was assigned male at birth.   Social history: Lives with: by hisself Work history: [employer]   Comes in today for follow up of the following chronic medical issues:  1. Essential hypertension No c/o chest pain, sob or headache. Does not check blood pressure at home. BP Readings from Last 3 Encounters:  09/10/22 128/76  06/11/22 128/84  04/25/22 123/78     2. Hyperlipidemia with target LDL less than 100 Does not watch diet and does no dedicated exercise. Lab Results  Component Value Date   CHOL 125 08/20/2022   HDL 30 (L) 08/20/2022   LDLCALC 62 08/20/2022   TRIG 201 (H) 08/20/2022   CHOLHDL 4.2 08/20/2022     3. Chronic kidney disease (CKD) stage G3a/A1, moderately decreased glomerular filtration rate (GFR) between 45-59 mL/min/1.73 square meter and albuminuria creatinine ratio less than 30 mg/g (HCC) Lab Results  Component Value Date   CREATININE 2.08 (H) 06/04/2022     4. Bradycardia No syncopal or near syncopal episodes.   5. Metabolic syndrome Does not check blood sugars at home. Lab Results  Component Value Date   HGBA1C 6.5 (H) 03/19/2022     6. Morbid obesity (HCC) No recent weight changes Wt Readings from Last 3 Encounters:  09/18/22 (!) 311 lb (141.1 kg)  09/10/22 (!) 312 lb 12.8 oz (141.9 kg)  06/11/22 (!) 309 lb 12.8 oz (140.5 kg)   BMI Readings from Last 3 Encounters:  09/18/22 41.03 kg/m  09/10/22 41.27 kg/m  06/11/22 40.87 kg/m     New complaints: None today  Allergies  Allergen Reactions   Lipitor [Atorvastatin] Other (See Comments)    Muscle aches   Outpatient Encounter Medications as of 09/18/2022  Medication Sig   acetaminophen (TYLENOL) 500 MG tablet Take 1,000 mg by mouth every 6 (six) hours as needed for  mild pain or moderate pain.   amLODipine (NORVASC) 10 MG tablet TAKE 1 TABLET BY MOUTH DAILY   Ascorbic Acid (VITAMIN C) 500 MG CAPS Take by mouth.   aspirin 81 MG tablet Take 81 mg by mouth daily.   Cholecalciferol (DIALYVITE VITAMIN D 5000 PO) Take by mouth.   Coenzyme Q10 (CO Q 10 PO) Take by mouth.   colchicine 0.6 MG tablet 2 tablets PO at gout onswet, may repeat 1x in 1 hour. No more then 3 per day.   fenofibrate (TRICOR) 145 MG tablet Take 1 tablet (145 mg total) by mouth daily.   fluticasone (FLONASE) 50 MCG/ACT nasal spray Place 2 sprays into both nostrils every morning.    metoprolol succinate (TOPROL-XL) 100 MG 24 hr tablet TAKE 1 TABLET BY MOUTH DAILY  TAKE WITH OR IMMEDIATELY  FOLLOWING A MEAL IN THE MORNING   Multiple Vitamin (MULTIVITAMIN) capsule Take 1 capsule by mouth every morning.    Multiple Vitamins-Minerals (ZINC PO) Take by mouth.   rosuvastatin (CRESTOR) 40 MG tablet Take 1 tablet (40 mg total) by mouth daily.   valsartan-hydrochlorothiazide (DIOVAN-HCT) 320-25 MG tablet TAKE 1 TABLET BY MOUTH DAILY   No facility-administered encounter medications on file as of 09/18/2022.    Past Surgical History:  Procedure Laterality Date   RETINAL DETACHMENT SURGERY      Family History  Problem Relation  Age of Onset   CAD Mother 56   CAD Father 110       CABG, died age 29   Colon cancer Neg Hx    Esophageal cancer Neg Hx    Stomach cancer Neg Hx    Rectal cancer Neg Hx       Controlled substance contract: n/a     Review of Systems  Constitutional:  Negative for diaphoresis.  Eyes:  Negative for pain.  Respiratory:  Negative for shortness of breath.   Cardiovascular:  Negative for chest pain, palpitations and leg swelling.  Gastrointestinal:  Negative for abdominal pain.  Endocrine: Negative for polydipsia.  Skin:  Negative for rash.  Neurological:  Negative for dizziness, weakness and headaches.  Hematological:  Does not bruise/bleed easily.  All other  systems reviewed and are negative.      Objective:   Physical Exam Vitals and nursing note reviewed.  Constitutional:      Appearance: Normal appearance. He is well-developed.  HENT:     Head: Normocephalic.     Nose: Nose normal.     Mouth/Throat:     Mouth: Mucous membranes are moist.     Pharynx: Oropharynx is clear.  Eyes:     Pupils: Pupils are equal, round, and reactive to light.  Neck:     Thyroid: No thyroid mass or thyromegaly.     Vascular: No carotid bruit or JVD.     Trachea: Phonation normal.  Cardiovascular:     Rate and Rhythm: Normal rate and regular rhythm.  Pulmonary:     Effort: Pulmonary effort is normal. No respiratory distress.     Breath sounds: Normal breath sounds.  Abdominal:     General: Bowel sounds are normal.     Palpations: Abdomen is soft.     Tenderness: There is no abdominal tenderness.  Musculoskeletal:        General: Normal range of motion.     Cervical back: Normal range of motion and neck supple.  Lymphadenopathy:     Cervical: No cervical adenopathy.  Skin:    General: Skin is warm and dry.  Neurological:     Mental Status: He is alert and oriented to person, place, and time.  Psychiatric:        Behavior: Behavior normal.        Thought Content: Thought content normal.        Judgment: Judgment normal.    BP 132/81   Pulse (!) 55   Temp 97.9 F (36.6 C) (Temporal)   Resp 20   Ht 6\' 1"  (1.854 m)   Wt (!) 311 lb (141.1 kg)   SpO2 97%   BMI 41.03 kg/m   Hgba1c 6.4%      Assessment & Plan:  Ian Stewart comes in today with chief complaint of Medical Management of Chronic Issues   Diagnosis and orders addressed:  1. Essential hypertension Low sodium diet - CBC with Differential/Platelet - CMP14+EGFR - amLODipine (NORVASC) 10 MG tablet; Take 1 tablet (10 mg total) by mouth daily.  Dispense: 90 tablet; Refill: 1 - metoprolol succinate (TOPROL-XL) 100 MG 24 hr tablet; TAKE 1 TABLET BY MOUTH DAILY  TAKE WITH OR  IMMEDIATELY  FOLLOWING A MEAL IN THE MORNING  Dispense: 90 tablet; Refill: 1 - valsartan-hydrochlorothiazide (DIOVAN-HCT) 320-25 MG tablet; Take 1 tablet by mouth daily.  Dispense: 90 tablet; Refill: 1  2. Hyperlipidemia with target LDL less than 100 Low fat diet - fenofibrate (TRICOR) 145 MG tablet;  Take 1 tablet (145 mg total) by mouth daily.  Dispense: 90 tablet; Refill: 1  3. Chronic kidney disease (CKD) stage G3a/A1, moderately decreased glomerular filtration rate (GFR) between 45-59 mL/min/1.73 square meter and albuminuria creatinine ratio less than 30 mg/g [facility]) Labs pending' keep follow up with nephrology  4. Bradycardia Report any syncopal or near syncopal episodes  5. Metabolic syndrome Watch carbs in diet - Bayer DCA Hb A1c Waived - Microalbumin / creatinine urine ratio  6. Morbid obesity (HCC) Discussed diet and exercise for person with BMI >25 Will recheck weight in 3-6 months   7. Screening for prostate cancer Labs pending - PSA, total and free   Labs pending Health Maintenance reviewed Diet and exercise encouraged  Follow up plan: 6 months   Mary-Margaret Daphine Deutscher, FNP

## 2022-09-19 LAB — CBC WITH DIFFERENTIAL/PLATELET
Basophils Absolute: 0 10*3/uL (ref 0.0–0.2)
Basos: 1 %
EOS (ABSOLUTE): 0.1 10*3/uL (ref 0.0–0.4)
Eos: 2 %
Hematocrit: 40 % (ref 37.5–51.0)
Hemoglobin: 13.3 g/dL (ref 13.0–17.7)
Immature Grans (Abs): 0 10*3/uL (ref 0.0–0.1)
Immature Granulocytes: 0 %
Lymphocytes Absolute: 2 10*3/uL (ref 0.7–3.1)
Lymphs: 32 %
MCH: 29.5 pg (ref 26.6–33.0)
MCHC: 33.3 g/dL (ref 31.5–35.7)
MCV: 89 fL (ref 79–97)
Monocytes Absolute: 0.7 10*3/uL (ref 0.1–0.9)
Monocytes: 11 %
Neutrophils Absolute: 3.3 10*3/uL (ref 1.4–7.0)
Neutrophils: 54 %
Platelets: 240 10*3/uL (ref 150–450)
RBC: 4.51 x10E6/uL (ref 4.14–5.80)
RDW: 14.1 % (ref 11.6–15.4)
WBC: 6.2 10*3/uL (ref 3.4–10.8)

## 2022-09-19 LAB — CMP14+EGFR
ALT: 24 IU/L (ref 0–44)
AST: 27 IU/L (ref 0–40)
Albumin: 4.4 g/dL (ref 3.8–4.9)
Alkaline Phosphatase: 43 IU/L — ABNORMAL LOW (ref 44–121)
BUN/Creatinine Ratio: 20 (ref 9–20)
BUN: 42 mg/dL — ABNORMAL HIGH (ref 6–24)
Bilirubin Total: 0.3 mg/dL (ref 0.0–1.2)
CO2: 23 mmol/L (ref 20–29)
Calcium: 9.8 mg/dL (ref 8.7–10.2)
Chloride: 101 mmol/L (ref 96–106)
Creatinine, Ser: 2.1 mg/dL — ABNORMAL HIGH (ref 0.76–1.27)
Globulin, Total: 2.4 g/dL (ref 1.5–4.5)
Glucose: 119 mg/dL — ABNORMAL HIGH (ref 70–99)
Potassium: 4 mmol/L (ref 3.5–5.2)
Sodium: 140 mmol/L (ref 134–144)
Total Protein: 6.8 g/dL (ref 6.0–8.5)
eGFR: 37 mL/min/{1.73_m2} — ABNORMAL LOW (ref >59)

## 2022-09-19 LAB — PSA, TOTAL AND FREE
PSA, Free Pct: 65 %
PSA, Free: 0.26 ng/mL
Prostate Specific Ag, Serum: 0.4 ng/mL (ref 0.0–4.0)

## 2022-09-19 LAB — MICROALBUMIN / CREATININE URINE RATIO
Creatinine, Urine: 93.7 mg/dL
Microalb/Creat Ratio: 1336 mg/g creat — ABNORMAL HIGH (ref 0–29)
Microalbumin, Urine: 1251.8 ug/mL

## 2022-09-30 DIAGNOSIS — R809 Proteinuria, unspecified: Secondary | ICD-10-CM | POA: Diagnosis not present

## 2022-09-30 DIAGNOSIS — E1122 Type 2 diabetes mellitus with diabetic chronic kidney disease: Secondary | ICD-10-CM | POA: Diagnosis not present

## 2022-09-30 DIAGNOSIS — I5042 Chronic combined systolic (congestive) and diastolic (congestive) heart failure: Secondary | ICD-10-CM | POA: Diagnosis not present

## 2022-09-30 DIAGNOSIS — N1832 Chronic kidney disease, stage 3b: Secondary | ICD-10-CM | POA: Diagnosis not present

## 2022-10-14 ENCOUNTER — Other Ambulatory Visit (HOSPITAL_COMMUNITY): Payer: Self-pay | Admitting: Nephrology

## 2022-10-14 DIAGNOSIS — E1122 Type 2 diabetes mellitus with diabetic chronic kidney disease: Secondary | ICD-10-CM

## 2022-10-14 DIAGNOSIS — I5042 Chronic combined systolic (congestive) and diastolic (congestive) heart failure: Secondary | ICD-10-CM

## 2022-10-14 DIAGNOSIS — N1832 Chronic kidney disease, stage 3b: Secondary | ICD-10-CM

## 2022-10-16 ENCOUNTER — Other Ambulatory Visit: Payer: BC Managed Care – PPO

## 2022-10-16 DIAGNOSIS — N1832 Chronic kidney disease, stage 3b: Secondary | ICD-10-CM | POA: Diagnosis not present

## 2022-10-16 DIAGNOSIS — R809 Proteinuria, unspecified: Secondary | ICD-10-CM | POA: Diagnosis not present

## 2022-10-16 DIAGNOSIS — N189 Chronic kidney disease, unspecified: Secondary | ICD-10-CM | POA: Diagnosis not present

## 2022-10-16 DIAGNOSIS — I5042 Chronic combined systolic (congestive) and diastolic (congestive) heart failure: Secondary | ICD-10-CM | POA: Diagnosis not present

## 2022-10-22 ENCOUNTER — Ambulatory Visit (HOSPITAL_COMMUNITY): Payer: BC Managed Care – PPO

## 2022-10-22 ENCOUNTER — Encounter (HOSPITAL_COMMUNITY): Payer: Self-pay

## 2022-10-28 DIAGNOSIS — I5042 Chronic combined systolic (congestive) and diastolic (congestive) heart failure: Secondary | ICD-10-CM | POA: Diagnosis not present

## 2022-10-28 DIAGNOSIS — N1832 Chronic kidney disease, stage 3b: Secondary | ICD-10-CM | POA: Diagnosis not present

## 2022-10-28 DIAGNOSIS — E1122 Type 2 diabetes mellitus with diabetic chronic kidney disease: Secondary | ICD-10-CM | POA: Diagnosis not present

## 2022-10-28 DIAGNOSIS — R808 Other proteinuria: Secondary | ICD-10-CM | POA: Diagnosis not present

## 2022-11-11 ENCOUNTER — Other Ambulatory Visit: Payer: Self-pay | Admitting: Nurse Practitioner

## 2022-11-11 ENCOUNTER — Other Ambulatory Visit: Payer: Self-pay | Admitting: Nephrology

## 2022-11-11 DIAGNOSIS — N1832 Chronic kidney disease, stage 3b: Secondary | ICD-10-CM

## 2022-11-11 DIAGNOSIS — E1122 Type 2 diabetes mellitus with diabetic chronic kidney disease: Secondary | ICD-10-CM

## 2022-11-11 DIAGNOSIS — I5042 Chronic combined systolic (congestive) and diastolic (congestive) heart failure: Secondary | ICD-10-CM

## 2022-12-01 ENCOUNTER — Ambulatory Visit (HOSPITAL_COMMUNITY): Payer: BC Managed Care – PPO

## 2022-12-01 ENCOUNTER — Encounter (HOSPITAL_COMMUNITY): Payer: Self-pay

## 2022-12-30 ENCOUNTER — Other Ambulatory Visit: Payer: BC Managed Care – PPO

## 2022-12-30 DIAGNOSIS — R808 Other proteinuria: Secondary | ICD-10-CM | POA: Diagnosis not present

## 2022-12-30 DIAGNOSIS — N189 Chronic kidney disease, unspecified: Secondary | ICD-10-CM | POA: Diagnosis not present

## 2022-12-30 DIAGNOSIS — R809 Proteinuria, unspecified: Secondary | ICD-10-CM | POA: Diagnosis not present

## 2022-12-30 DIAGNOSIS — N1832 Chronic kidney disease, stage 3b: Secondary | ICD-10-CM | POA: Diagnosis not present

## 2022-12-31 ENCOUNTER — Other Ambulatory Visit: Payer: Self-pay | Admitting: Physician Assistant

## 2023-01-07 DIAGNOSIS — E1122 Type 2 diabetes mellitus with diabetic chronic kidney disease: Secondary | ICD-10-CM | POA: Diagnosis not present

## 2023-01-07 DIAGNOSIS — I5042 Chronic combined systolic (congestive) and diastolic (congestive) heart failure: Secondary | ICD-10-CM | POA: Diagnosis not present

## 2023-01-07 DIAGNOSIS — N1832 Chronic kidney disease, stage 3b: Secondary | ICD-10-CM | POA: Diagnosis not present

## 2023-01-07 DIAGNOSIS — E1129 Type 2 diabetes mellitus with other diabetic kidney complication: Secondary | ICD-10-CM | POA: Diagnosis not present

## 2023-02-16 NOTE — Progress Notes (Unsigned)
  Cardiology Office Note:   Date:  02/16/2023  ID:  Ian Stewart, DOB 1970/12/29, MRN [id number] PCP: Bennie Pierini, FNP   HeartCare Providers Cardiologist:  Rollene Rotunda, MD {  History of Present Illness:   Ian Stewart is a 52 y.o. male who was referred by Bennie Pierini, FNP for evaluation of  bradycardia.  This was noted on his Apple Watch.  He has had some chest pain and had a negative POET (Plain Old Exercise Treadmill) in 2019.  Echocardiogram in January 2021 demonstrated a mildly reduced ejection fraction 45 to 50%.  He returns for follow-up.   Since I last saw him he had chest pain.  He was going to have a PET but he could not afford the copay.  ***   ***  he has done well.  He has a very active job where he does a lot of walking.  He very has a mile and this morning. The patient denies any new symptoms such as chest discomfort, neck or arm discomfort. There has been no new shortness of breath, PND or orthopnea. There have been no reported palpitations, presyncope or syncope.       ROS: ***  Studies Reviewed:    EKG:       ***  Risk Assessment/Calculations:   {Does this patient have ATRIAL FIBRILLATION?[phone]} No BP recorded.  {Refresh Note OR Click here to enter BP  :1}***   STOP-Bang Score:  5  { Consider Dx Sleep Disordered Breathing or Sleep Apnea  ICD G47.33          :1}     Physical Exam:   VS:  There were no vitals taken for this visit.   Wt Readings from Last 3 Encounters:  09/18/22 (!) 311 lb (141.1 kg)  09/10/22 (!) 312 lb 12.8 oz (141.9 kg)  06/11/22 (!) 309 lb 12.8 oz (140.5 kg)     GEN: Well nourished, well developed in no acute distress NECK: No JVD; No carotid bruits CARDIAC: ***RR, *** murmurs, rubs, gallops RESPIRATORY:  Clear to auscultation without rales, wheezing or rhonchi  ABDOMEN: Soft, non-tender, non-distended EXTREMITIES:  No edema; No deformity   ASSESSMENT AND PLAN:   HTN:   His blood pressure is ***   controlled.  No change in therapy.    CARDIOMYOPATHY:   ***  He has no symptoms.  He is on optimal medical therapy.  He is up-to-date with follow-up labs.  I likely will check an echo next year.   BRADYCARDIA:     ***  He has no symptoms related to this.  No change in therapy.   DYSLIPIDEMIA:  ***  Lipids were very mildly elevated with an LDL of 109 in June.  He understands need for diet and we talked specifically about plant-based diet   OBESITY:   ***    He understands the need for weight loss with diet and exercise.   CKD: His creatinine last was ***  1.85 and down from previous.   CHEST PAIN:  ***         Follow up ***  Signed, Rollene Rotunda, MD

## 2023-02-19 ENCOUNTER — Ambulatory Visit: Payer: BC Managed Care – PPO | Attending: Cardiology | Admitting: Cardiology

## 2023-02-19 ENCOUNTER — Encounter: Payer: Self-pay | Admitting: Cardiology

## 2023-02-19 VITALS — BP 136/96 | HR 59 | Ht 73.0 in | Wt 309.0 lb

## 2023-02-19 DIAGNOSIS — I420 Dilated cardiomyopathy: Secondary | ICD-10-CM

## 2023-02-19 DIAGNOSIS — N1831 Chronic kidney disease, stage 3a: Secondary | ICD-10-CM

## 2023-02-19 DIAGNOSIS — R072 Precordial pain: Secondary | ICD-10-CM | POA: Diagnosis not present

## 2023-02-19 DIAGNOSIS — E785 Hyperlipidemia, unspecified: Secondary | ICD-10-CM

## 2023-02-19 NOTE — Patient Instructions (Signed)
Medication Instructions:  No changes.  *If you need a refill on your cardiac medications before your next appointment, please call your pharmacy*   Testing/Procedures: Your physician has requested that you have an echocardiogram. Echocardiography is a painless test that uses sound waves to create images of your heart. It provides your doctor with information about the size and shape of your heart and how well your heart's chambers and valves are working. This procedure takes approximately one hour. There are no restrictions for this procedure. Please do NOT wear cologne, perfume, aftershave, or lotions (deodorant is allowed). Please arrive 15 minutes prior to your appointment time. [address].   Please note: We ask at that you not bring children with you during ultrasound (echo/ vascular) testing. Due to room size and safety concerns, children are not allowed in the ultrasound rooms during exams. Our front office staff cannot provide observation of children in our lobby area while testing is being conducted. An adult accompanying a patient to their appointment will only be allowed in the ultrasound room at the discretion of the ultrasound technician under special circumstances. We apologize for any inconvenience.    Follow-Up: At [facility], you and your health needs are our priority.  As part of our continuing mission to provide you with exceptional heart care, we have created designated Provider Care Teams.  These Care Teams include your primary Cardiologist (physician) and Advanced Practice Providers (APPs -  Physician Assistants and Nurse Practitioners) who all work together to provide you with the care you need, when you need it.  Your next appointment:   12 month(s)  Provider:   Rollene Rotunda, MD

## 2023-02-23 ENCOUNTER — Other Ambulatory Visit: Payer: BC Managed Care – PPO

## 2023-02-23 DIAGNOSIS — D631 Anemia in chronic kidney disease: Secondary | ICD-10-CM | POA: Diagnosis not present

## 2023-02-23 DIAGNOSIS — R809 Proteinuria, unspecified: Secondary | ICD-10-CM | POA: Diagnosis not present

## 2023-02-23 DIAGNOSIS — N189 Chronic kidney disease, unspecified: Secondary | ICD-10-CM | POA: Diagnosis not present

## 2023-02-25 ENCOUNTER — Other Ambulatory Visit: Payer: Self-pay | Admitting: Physician Assistant

## 2023-03-05 DIAGNOSIS — E1129 Type 2 diabetes mellitus with other diabetic kidney complication: Secondary | ICD-10-CM | POA: Diagnosis not present

## 2023-03-05 DIAGNOSIS — E1122 Type 2 diabetes mellitus with diabetic chronic kidney disease: Secondary | ICD-10-CM | POA: Diagnosis not present

## 2023-03-05 DIAGNOSIS — N1832 Chronic kidney disease, stage 3b: Secondary | ICD-10-CM | POA: Diagnosis not present

## 2023-03-10 ENCOUNTER — Other Ambulatory Visit: Payer: Self-pay | Admitting: Nurse Practitioner

## 2023-03-10 DIAGNOSIS — I10 Essential (primary) hypertension: Secondary | ICD-10-CM

## 2023-03-10 DIAGNOSIS — E785 Hyperlipidemia, unspecified: Secondary | ICD-10-CM

## 2023-03-19 ENCOUNTER — Ambulatory Visit (HOSPITAL_COMMUNITY): Payer: BC Managed Care – PPO | Attending: Cardiology

## 2023-03-19 DIAGNOSIS — I420 Dilated cardiomyopathy: Secondary | ICD-10-CM | POA: Insufficient documentation

## 2023-03-19 LAB — ECHOCARDIOGRAM COMPLETE: S' Lateral: 4.3 cm

## 2023-03-23 ENCOUNTER — Ambulatory Visit: Payer: BC Managed Care – PPO | Admitting: Nurse Practitioner

## 2023-03-25 ENCOUNTER — Other Ambulatory Visit: Payer: Self-pay

## 2023-03-25 MED ORDER — SACUBITRIL-VALSARTAN 24-26 MG PO TABS: 1.0000 | ORAL_TABLET | Freq: Two times a day (BID) | ORAL | 3 refills | Status: DC

## 2023-03-26 ENCOUNTER — Other Ambulatory Visit: Payer: Self-pay

## 2023-03-26 MED ORDER — SACUBITRIL-VALSARTAN 49-51 MG PO TABS: 1.0000 | ORAL_TABLET | Freq: Two times a day (BID) | ORAL | 3 refills | Status: DC

## 2023-03-26 MED ORDER — SACUBITRIL-VALSARTAN 24-26 MG PO TABS: 1.0000 | ORAL_TABLET | Freq: Two times a day (BID) | ORAL | 3 refills | Status: DC

## 2023-03-27 ENCOUNTER — Other Ambulatory Visit: Payer: Self-pay | Admitting: Nurse Practitioner

## 2023-03-27 DIAGNOSIS — I10 Essential (primary) hypertension: Secondary | ICD-10-CM

## 2023-04-02 ENCOUNTER — Ambulatory Visit: Payer: BC Managed Care – PPO | Admitting: Nurse Practitioner

## 2023-04-02 VITALS — BP 130/81 | HR 66 | Temp 98.1°F | Ht 73.0 in | Wt 309.0 lb

## 2023-04-02 DIAGNOSIS — I10 Essential (primary) hypertension: Secondary | ICD-10-CM

## 2023-04-02 DIAGNOSIS — N1831 Chronic kidney disease, stage 3a: Secondary | ICD-10-CM | POA: Diagnosis not present

## 2023-04-02 DIAGNOSIS — R001 Bradycardia, unspecified: Secondary | ICD-10-CM

## 2023-04-02 DIAGNOSIS — R7309 Other abnormal glucose: Secondary | ICD-10-CM | POA: Diagnosis not present

## 2023-04-02 DIAGNOSIS — E8881 Metabolic syndrome: Secondary | ICD-10-CM | POA: Diagnosis not present

## 2023-04-02 DIAGNOSIS — E785 Hyperlipidemia, unspecified: Secondary | ICD-10-CM

## 2023-04-02 LAB — BAYER DCA HB A1C WAIVED: HB A1C (BAYER DCA - WAIVED): 6.1 % — ABNORMAL HIGH (ref 4.8–5.6)

## 2023-04-02 MED ORDER — FENOFIBRATE 145 MG PO TABS: 145.0000 mg | ORAL_TABLET | Freq: Every day | ORAL | 1 refills | Status: DC

## 2023-04-02 MED ORDER — AMLODIPINE BESYLATE 10 MG PO TABS: 10.0000 mg | ORAL_TABLET | Freq: Every day | ORAL | 1 refills | Status: DC

## 2023-04-02 MED ORDER — SPIRONOLACTONE 25 MG PO TABS: 12.5000 mg | ORAL_TABLET | Freq: Every day | ORAL | 1 refills | Status: DC

## 2023-04-02 MED ORDER — METOPROLOL SUCCINATE ER 100 MG PO TB24: ORAL_TABLET | ORAL | 1 refills | Status: DC

## 2023-04-02 NOTE — Progress Notes (Signed)
Subjective:    Patient ID: Ian Stewart, male    DOB: 1970/05/16, 53 y.o.   MRN: [id number]   Chief Complaint: medical management of chronic issues     HPI:  Ian Stewart is a 53 y.o. who identifies as a male who was assigned male at birth.   Social history: Lives with: by hisself Work history: [employer]   Comes in today for follow up of the following chronic medical issues:  1. Essential hypertension No c/o chest pain, sob or headache. Does not check blood pressure at home. BP Readings from Last 3 Encounters:  02/19/23 (!) 136/96  09/18/22 132/81  09/10/22 128/76     2. Hyperlipidemia with target LDL less than 100 Does not watch diet and does no dedicated exercise. Lab Results  Component Value Date   CHOL 125 08/20/2022   HDL 30 (L) 08/20/2022   LDLCALC 62 08/20/2022   TRIG 201 (H) 08/20/2022   CHOLHDL 4.2 08/20/2022     3. Chronic kidney disease (CKD) stage G3a/A1, moderately decreased glomerular filtration rate (GFR) between 45-59 mL/min/1.73 square meter and albuminuria creatinine ratio less than 30 mg/g (HCC) Sees nephrology every 3 months. Has follow up in March. Lab Results  Component Value Date   CREATININE 2.10 (H) 09/18/2022     4. Bradycardia No syncopal or near syncopal episodes.   5. Metabolic syndrome Does not check blood sugars at home. Lab Results  Component Value Date   HGBA1C 6.4 (H) 09/18/2022     6. Morbid obesity (HCC) No recent weight changes  Wt Readings from Last 3 Encounters:  04/02/23 (!) 309 lb (140.2 kg)  02/19/23 (!) 309 lb (140.2 kg)  09/18/22 (!) 311 lb (141.1 kg)   BMI Readings from Last 3 Encounters:  04/02/23 40.77 kg/m  02/19/23 40.77 kg/m  09/18/22 41.03 kg/m      New complaints: Had echo by cardiologist and was started on entresto BID.   Allergies  Allergen Reactions   Lipitor [Atorvastatin] Other (See Comments)    Muscle aches   Outpatient Encounter Medications as of 04/02/2023  Medication Sig    acetaminophen (TYLENOL) 500 MG tablet Take 1,000 mg by mouth every 6 (six) hours as needed for mild pain or moderate pain.   amLODipine (NORVASC) 10 MG tablet TAKE 1 TABLET BY MOUTH DAILY   Ascorbic Acid (VITAMIN C) 500 MG CAPS Take by mouth.   aspirin 81 MG tablet Take 81 mg by mouth daily.   Cholecalciferol (DIALYVITE VITAMIN D 5000 PO) Take by mouth.   Coenzyme Q10 (CO Q 10 PO) Take by mouth.   colchicine 0.6 MG tablet 2 tablets PO at gout onswet, may repeat 1x in 1 hour. No more then 3 per day.   FARXIGA 5 MG TABS tablet Take 5 mg by mouth daily.   fenofibrate (TRICOR) 145 MG tablet TAKE 1 TABLET BY MOUTH DAILY   fluticasone (FLONASE) 50 MCG/ACT nasal spray Place 2 sprays into both nostrils every morning.    metoprolol succinate (TOPROL-XL) 100 MG 24 hr tablet TAKE 1 TABLET BY MOUTH DAILY  TAKE WITH OR IMMEDIATELY  FOLLOWING A MEAL IN THE MORNING   Multiple Vitamin (MULTIVITAMIN) capsule Take 1 capsule by mouth every morning.    Multiple Vitamins-Minerals (ZINC PO) Take by mouth.   rosuvastatin (CRESTOR) 40 MG tablet TAKE 1 TABLET BY MOUTH DAILY   sacubitril-valsartan (ENTRESTO) 49-51 MG Take 1 tablet by mouth 2 (two) times daily.   spironolactone (ALDACTONE) 25 MG tablet Take 12.5  mg by mouth daily.   No facility-administered encounter medications on file as of 04/02/2023.    Past Surgical History:  Procedure Laterality Date   RETINAL DETACHMENT SURGERY      Family History  Problem Relation Age of Onset   CAD Mother 81   CAD Father 37       CABG, died age 45   Colon cancer Neg Hx    Esophageal cancer Neg Hx    Stomach cancer Neg Hx    Rectal cancer Neg Hx       Controlled substance contract: n/a     Review of Systems  Constitutional:  Negative for diaphoresis.  Eyes:  Negative for pain.  Respiratory:  Negative for shortness of breath.   Cardiovascular:  Negative for chest pain, palpitations and leg swelling.  Gastrointestinal:  Negative for abdominal pain.   Endocrine: Negative for polydipsia.  Skin:  Negative for rash.  Neurological:  Negative for dizziness, weakness and headaches.  Hematological:  Does not bruise/bleed easily.  All other systems reviewed and are negative.      Objective:   Physical Exam Vitals and nursing note reviewed.  Constitutional:      Appearance: Normal appearance. He is well-developed.  HENT:     Head: Normocephalic.     Nose: Nose normal.     Mouth/Throat:     Mouth: Mucous membranes are moist.     Pharynx: Oropharynx is clear.  Eyes:     Pupils: Pupils are equal, round, and reactive to light.  Neck:     Thyroid: No thyroid mass or thyromegaly.     Vascular: No carotid bruit or JVD.     Trachea: Phonation normal.  Cardiovascular:     Rate and Rhythm: Normal rate and regular rhythm.  Pulmonary:     Effort: Pulmonary effort is normal. No respiratory distress.     Breath sounds: Normal breath sounds.  Abdominal:     General: Bowel sounds are normal.     Palpations: Abdomen is soft.     Tenderness: There is no abdominal tenderness.  Musculoskeletal:        General: Normal range of motion.     Cervical back: Normal range of motion and neck supple.  Lymphadenopathy:     Cervical: No cervical adenopathy.  Skin:    General: Skin is warm and dry.  Neurological:     Mental Status: He is alert and oriented to person, place, and time.  Psychiatric:        Behavior: Behavior normal.        Thought Content: Thought content normal.        Judgment: Judgment normal.    BP 130/81   Pulse 66   Temp 98.1 F (36.7 C) (Temporal)   Ht 6\' 1"  (1.854 m)   Wt (!) 309 lb (140.2 kg)   SpO2 97%   BMI 40.77 kg/m    Hgba1c 6.1%      Assessment & Plan:  Ian Stewart comes in today with chief complaint of No chief complaint on file.   Diagnosis and orders addressed:  1. Essential hypertension Low sodium diet - CBC with Differential/Platelet - CMP14+EGFR - amLODipine (NORVASC) 10 MG tablet; Take 1  tablet (10 mg total) by mouth daily.  Dispense: 90 tablet; Refill: 1 - metoprolol succinate (TOPROL-XL) 100 MG 24 hr tablet; TAKE 1 TABLET BY MOUTH DAILY  TAKE WITH OR IMMEDIATELY  FOLLOWING A MEAL IN THE MORNING  Dispense: 90 tablet; Refill: 1 -  valsartan-hydrochlorothiazide (DIOVAN-HCT) 320-25 MG tablet; Take 1 tablet by mouth daily.  Dispense: 90 tablet; Refill: 1  2. Hyperlipidemia with target LDL less than 100 Low fat diet - fenofibrate (TRICOR) 145 MG tablet; Take 1 tablet (145 mg total) by mouth daily.  Dispense: 90 tablet; Refill: 1  3. Chronic kidney disease (CKD) stage G3a/A1, moderately decreased glomerular filtration rate (GFR) between 45-59 mL/min/1.73 square meter and albuminuria creatinine ratio less than 30 mg/g [facility]) Labs pending' keep follow up with nephrology  4. Bradycardia Report any syncopal or near syncopal episodes  5. Metabolic syndrome Watch carbs in diet - Bayer DCA Hb A1c Waived - Microalbumin / creatinine urine ratio  6. Morbid obesity (HCC) Discussed diet and exercise for person with BMI >25 Will recheck weight in 3-6 months   Labs pending Health Maintenance reviewed Diet and exercise encouraged  Follow up plan: 6 months   Mary-Margaret Daphine Deutscher, FNP

## 2023-04-02 NOTE — Patient Instructions (Signed)
Chronic Kidney Disease: Eating Plan Chronic kidney disease (CKD) is when your kidneys aren't working well. They can't remove waste, fluids, and other substances from your blood. When these substances build up, they can worsen kidney damage and affect your health. Eating certain foods can lead to a buildup of these substances. Changing your diet can help prevent more kidney damage. Diet changes may also delay dialysis or even keep you from needing it. What nutrients should I limit? Work with your health care team and an expert in healthy eating (dietitian) to make a meal plan that's right for you. Foods you can eat and foods you should limit or avoid will depend on: The stage of your kidney disease. Any other conditions you have. The items listed below are not a complete list. Talk with a dietitian to learn what's best for you. Potassium Potassium affects how well your heart beats. Too much potassium in your blood can cause an irregular heartbeat or even a heart attack. You may need to limit foods that are high in potassium, such as: Liquid milk and soy milk. Salt substitutes that contain potassium. Fruits like: Bananas. Apricots. Melon. Prunes and raisins. Kiwi. Nectarines and oranges. Vegetables, such as: Potatoes, sweet potatoes, and yams. Tomatoes. Leafy greens. Beets. Avocado. Pumpkin and winter squash. Beans, like lima beans. Nuts. Phosphorus Phosphorus is a mineral found in your bones. You need a balance between calcium and phosphorus to build and maintain healthy bones.  Too much added phosphorus from the foods you eat can pull calcium from your bones. Losing calcium can make your bones weak and more likely to break. Too much phosphorus can also make your skin itch. You may need to limit foods that are high in phosphorus or that have added phosphorus, such as: Liquid milk and dairy products. Dark-colored sodas or soft drinks. Bran cereals and oatmeal. Protein  Protein  helps your body make and keep muscle. Protein also helps to repair your body's cells and tissues.  One of the natural breakdown products of protein is a waste product called urea. When your kidneys aren't working well, they can't remove waste like urea. Reducing protein in your diet can help keep urea from building up in your blood. Depending on your stage of kidney disease, you may need to eat smaller portions of foods that are high in protein. Sources of animal protein include: Meat (all types). Fish and seafood. Poultry. Eggs. Dairy. Other protein foods include: Beans and legumes. Nuts and nut butter. Soy, like tofu.  Sodium Salt (sodium) helps to keep a healthy balance of fluids in your body. Too much salt can increase your blood pressure, which can harm your heart and lungs. Extra salt can also cause your body to keep too much fluid, making your kidneys work harder. You may need to limit or avoid foods that are high in salt, such as: Salt seasonings. Soy and teriyaki sauce. Meats that are: Packaged. Precooked. Cured. Processed. Salted crackers and snack foods. Fast food. Canned soups and foods. Pickled foods. Boxed mixes or ready-to-eat boxed meals and side dishes. Bottled dressings, sauces, and marinades. Talk with your dietitian about how much potassium, phosphorus, protein, and salt you may have each day. What are tips for following this plan? Reading food labels  Check the amount of salt in foods. Limit foods that have salt listed among the first five ingredients. Try to eat low-salt foods. Check the ingredient list for added phosphorus or potassium. "Phos" in an ingredient is a sign that  phosphorus has been added. Do not buy foods that are calcium-enriched or that have calcium added to them (fortified). Buy canned vegetables and beans that say "no salt added." Rinse them before eating. Lifestyle Limit the amount of protein you eat from animal sources each day. Focus on  protein from plant sources, like tofu and dried beans, peas, and lentils. Do not add salt to food when cooking or before eating. Do not eat star fruit. It can be toxic for people with kidney problems. Talk with your health care provider before taking any vitamin or mineral supplements. If told by your provider: Track how much liquid you have so you can avoid drinking too much. Try to eat foods that are made mostly from water, like gelatin, ice cream, soups, and juicy fruits and vegetables. If you have diabetes and chronic kidney disease: If you have diabetes and CKD, you need to keep your blood sugar (glucose) in the target range recommended by your provider. Follow your diabetes management plan. This may include: Checking your blood glucose regularly. Taking medicines by mouth, or taking insulin, or both. Exercising for at least 30 minutes on 5 or more days each week, or as told by your provider. Tracking how many servings of carbohydrates you eat at each meal. Not using orange juice to treat low blood sugars. Instead, use apple juice, cranberry juice, or clear soda. You may be given guidelines on what foods and nutrients you may eat, and how much you can have each day. This depends on your stage of kidney disease and whether you have high blood pressure. Follow the meal plan your dietitian gives you. Where to find more information [employer] of Diabetes and Digestive and Kidney Diseases: StageSync.si National Kidney Foundation: kidney.org This information is not intended to replace advice given to you by your health care provider. Make sure you discuss any questions you have with your health care provider. Document Revised: 10/07/2022 Document Reviewed: 10/07/2022 Elsevier Patient Education  2024 [employer].

## 2023-04-03 LAB — CMP14+EGFR
ALT: 29 [IU]/L (ref 0–44)
AST: 31 [IU]/L (ref 0–40)
Albumin: 4.4 g/dL (ref 3.8–4.9)
Alkaline Phosphatase: 35 [IU]/L — ABNORMAL LOW (ref 44–121)
BUN/Creatinine Ratio: 17 (ref 9–20)
BUN: 48 mg/dL — ABNORMAL HIGH (ref 6–24)
Bilirubin Total: 0.3 mg/dL (ref 0.0–1.2)
CO2: 23 mmol/L (ref 20–29)
Calcium: 10.2 mg/dL (ref 8.7–10.2)
Chloride: 102 mmol/L (ref 96–106)
Creatinine, Ser: 2.85 mg/dL — ABNORMAL HIGH (ref 0.76–1.27)
Globulin, Total: 2.5 g/dL (ref 1.5–4.5)
Glucose: 124 mg/dL — ABNORMAL HIGH (ref 70–99)
Potassium: 4.5 mmol/L (ref 3.5–5.2)
Sodium: 141 mmol/L (ref 134–144)
Total Protein: 6.9 g/dL (ref 6.0–8.5)
eGFR: 25 mL/min/{1.73_m2} — ABNORMAL LOW (ref >59)

## 2023-04-03 LAB — CBC WITH DIFFERENTIAL/PLATELET
Basophils Absolute: 0 10*3/uL (ref 0.0–0.2)
Basos: 1 %
EOS (ABSOLUTE): 0.1 10*3/uL (ref 0.0–0.4)
Eos: 2 %
Hematocrit: 41.4 % (ref 37.5–51.0)
Hemoglobin: 13.8 g/dL (ref 13.0–17.7)
Immature Grans (Abs): 0 10*3/uL (ref 0.0–0.1)
Immature Granulocytes: 0 %
Lymphocytes Absolute: 2.1 10*3/uL (ref 0.7–3.1)
Lymphs: 39 %
MCH: 29.6 pg (ref 26.6–33.0)
MCHC: 33.3 g/dL (ref 31.5–35.7)
MCV: 89 fL (ref 79–97)
Monocytes Absolute: 0.6 10*3/uL (ref 0.1–0.9)
Monocytes: 11 %
Neutrophils Absolute: 2.6 10*3/uL (ref 1.4–7.0)
Neutrophils: 47 %
Platelets: 228 10*3/uL (ref 150–450)
RBC: 4.66 x10E6/uL (ref 4.14–5.80)
RDW: 14.1 % (ref 11.6–15.4)
WBC: 5.5 10*3/uL (ref 3.4–10.8)

## 2023-04-03 LAB — LIPID PANEL
Chol/HDL Ratio: 6.4 {ratio} — ABNORMAL HIGH (ref 0.0–5.0)
Cholesterol, Total: 178 mg/dL (ref 100–199)
HDL: 28 mg/dL — ABNORMAL LOW (ref >39)
LDL Chol Calc (NIH): 96 mg/dL (ref 0–99)
Triglycerides: 322 mg/dL — ABNORMAL HIGH (ref 0–149)
VLDL Cholesterol Cal: 54 mg/dL — ABNORMAL HIGH (ref 5–40)

## 2023-04-19 NOTE — Progress Notes (Deleted)
  Cardiology Office Note:  .   Date:  04/19/2023  ID:  Ian Stewart, DOB 11/07/1970, MRN [id number] PCP: Bennie Pierini, FNP   HeartCare Providers Cardiologist:  Rollene Rotunda, MD  }   History of Present Illness: .   Ian Stewart is a 53 y.o. male with history of bradycardia which was noted on his Apple Watch, negative exercise stress test in 2019, nonischemic cardiomyopathy with EF of 45 to 50%.  He has chronic kidney disease and is being followed by his nephrologist.  He was started on Farxiga and spironolactone by nephrology.  He was advised to have a sleep study and a PET scan but he was unable to pay for this due to insurance costs of his co-pay.  Repeat echocardiogram was completed on 03/21/2023 with continued reduced EF now at 40 to 45%.  Valsartan HCTZ was discontinued and Entresto 49 to 51 mg p.o. twice daily was added he is here for follow-up to evaluate his response to medications.  ROS: ***  Studies Reviewed: .       Echocardiogram 03/19/2023 1. Left ventricular ejection fraction, by estimation, is 40 to 45%. Left  ventricular ejection fraction by 3D volume is 42 %. The left ventricle has  mildly decreased function. The left ventricle has no regional wall motion  abnormalities. There is  moderate concentric left ventricular hypertrophy. Left ventricular  diastolic parameters are consistent with Grade I diastolic dysfunction  (impaired relaxation).   2. Right ventricular systolic function is normal. The right ventricular  size is normal. Tricuspid regurgitation signal is inadequate for assessing  PA pressure.   3. The mitral valve is normal in structure. No evidence of mitral valve  regurgitation. No evidence of mitral stenosis.   4. The aortic valve is normal in structure. Aortic valve regurgitation is  mild. No aortic stenosis is present.   5. There is borderline dilatation of the aortic root, measuring 37 mm.  There is borderline dilatation of the ascending  aorta, measuring 37 mm.   6. The inferior vena cava is normal in size with greater than 50%  respiratory variability, suggesting right atrial pressure of 3 mmHg.   *** EKG Interpretation Date/Time:    Ventricular Rate:    PR Interval:    QRS Duration:    QT Interval:    QTC Calculation:   R Axis:      Text Interpretation:      Physical Exam:   VS:  There were no vitals taken for this visit.   Wt Readings from Last 3 Encounters:  04/02/23 (!) 309 lb (140.2 kg)  02/19/23 (!) 309 lb (140.2 kg)  09/18/22 (!) 311 lb (141.1 kg)    GEN: Well nourished, well developed in no acute distress NECK: No JVD; No carotid bruits CARDIAC: ***RRR, no murmurs, rubs, gallops RESPIRATORY:  Clear to auscultation without rales, wheezing or rhonchi  ABDOMEN: Soft, non-tender, non-distended EXTREMITIES:  No edema; No deformity   ASSESSMENT AND PLAN: .   ***    {Are you ordering a CV Procedure (e.g. stress test, cath, DCCV, TEE, etc)?   Press F2        :[id number]}    Signed, Bettey Mare. Liborio Nixon, ANP, AACC

## 2023-04-20 NOTE — Progress Notes (Signed)
Cardiology Office Note:  .   Date:  04/21/2023  ID:  Ian Stewart, DOB 04-Jan-1971, MRN [id number] PCP: Bennie Pierini, FNP  [facility] Providers Cardiologist:  Rollene Rotunda, MD  }   History of Present Illness: .   Ian Stewart is a 53 y.o. male with history of bradycardia which was noted on his Apple Watch, negative exercise stress test in 2019, nonischemic cardiomyopathy with EF of 45 to 50%.  He has chronic kidney disease and is being followed by his nephrologist.  He was started on Farxiga and spironolactone by nephrology.    He was advised to have a sleep study and a PET scan but he was unable to pay for this due to insurance costs of his co-pay.  Repeat echocardiogram was completed on 03/21/2023 with continued reduced EF now at 40 to 45%.  Valsartan HCTZ was discontinued and Entresto 49 to 51 mg p.o. twice daily was added he is here for follow-up to evaluate his response to medications.  He comes today without any cardiac complaints.  He is not weighing himself daily or keeping up with his blood pressure however.  He denies any dyspnea on exertion, PND or orthopnea.  He denies any edema.  His weight is up 4 pounds since being seen last on 04/02/2023.  He is not adding salt to his food but may be eating salted foods.  He states that he is not feeling swollen or having any evidence of edema into it in his stomach.  He is medically compliant.  ROS: As above otherwise negative  Studies Reviewed: .       Echocardiogram 03/19/2023 1. Left ventricular ejection fraction, by estimation, is 40 to 45%. Left  ventricular ejection fraction by 3D volume is 42 %. The left ventricle has  mildly decreased function. The left ventricle has no regional wall motion  abnormalities. There is  moderate concentric left ventricular hypertrophy. Left ventricular  diastolic parameters are consistent with Grade I diastolic dysfunction  (impaired relaxation).   2. Right ventricular systolic function is  normal. The right ventricular  size is normal. Tricuspid regurgitation signal is inadequate for assessing  PA pressure.   3. The mitral valve is normal in structure. No evidence of mitral valve  regurgitation. No evidence of mitral stenosis.   4. The aortic valve is normal in structure. Aortic valve regurgitation is  mild. No aortic stenosis is present.   5. There is borderline dilatation of the aortic root, measuring 37 mm.  There is borderline dilatation of the ascending aorta, measuring 37 mm.   6. The inferior vena cava is normal in size with greater than 50%  respiratory variability, suggesting right atrial pressure of 3 mmHg.    Physical Exam:   VS:  BP (!) 140/100 (BP Location: Left Arm, Patient Position: Sitting, Cuff Size: Large)   Pulse 66   Ht 6\' 1"  (1.854 m)   Wt (!) 313 lb (142 kg)   SpO2 96%   BMI 41.30 kg/m    Wt Readings from Last 3 Encounters:  04/21/23 (!) 313 lb (142 kg)  04/02/23 (!) 309 lb (140.2 kg)  02/19/23 (!) 309 lb (140.2 kg)    GEN: Well nourished, well developed in no acute distress NECK: No JVD; No carotid bruits CARDIAC: RRR, distant heart sounds no murmurs, rubs, gallops RESPIRATORY:  Clear to auscultation without rales, wheezing or rhonchi  ABDOMEN: Soft, non-tender, non-distended, obese EXTREMITIES:  No edema; No deformity   ASSESSMENT AND PLAN: .  Combined CHF: Most recent echocardiogram completed on 03/19/2023 revealed continued reduction of ejection fraction with a EF of 40 to 45% and grade 1 diastolic dysfunction.  There is no evidence of significant valvular abnormalities.  The patient's Sherryll Burger will be increased to 91/103 mg twice daily.  He is just started the new dose of 49/51mg  and will finish that out in the end of March therefore we will start the Entresto at the higher dose in April 2025.  He will have a repeat echocardiogram in August 2025 to compare his current status with EF on GDMT.  He is advised to weigh himself daily to  ascertain if he is retaining fluid.  May need to add a low-dose diuretic if he has fluid retention despite GDMT.  2.  Hypertension: Extremely elevated diastolic pressure.  He is advised to take his blood pressure and call us for elevated blood pressures especially those greater than 150/90.  The patient is in a hurry today and wants to get onto work and I believe his blood pressure is indicative of that.  He has taken this medication.  He does have room for increased dose of Entresto.  3.  Hyperlipidemia: He remains on rosuvastatin 40 mg daily.  He will need to have fasting lipids and LFTs on next office visit if not completed by PCP.         Signed, Bettey Mare. Liborio Nixon, ANP, AACC

## 2023-04-21 ENCOUNTER — Ambulatory Visit: Payer: BC Managed Care – PPO | Admitting: Adult Health

## 2023-04-21 ENCOUNTER — Encounter: Payer: Self-pay | Admitting: Adult Health

## 2023-04-21 ENCOUNTER — Ambulatory Visit: Payer: BC Managed Care – PPO | Attending: Adult Health | Admitting: Adult Health

## 2023-04-21 VITALS — BP 140/100 | HR 66 | Ht 73.0 in | Wt 313.0 lb

## 2023-04-21 DIAGNOSIS — I5042 Chronic combined systolic (congestive) and diastolic (congestive) heart failure: Secondary | ICD-10-CM | POA: Diagnosis not present

## 2023-04-21 DIAGNOSIS — E7800 Pure hypercholesterolemia, unspecified: Secondary | ICD-10-CM | POA: Diagnosis not present

## 2023-04-21 DIAGNOSIS — I10 Essential (primary) hypertension: Secondary | ICD-10-CM

## 2023-04-21 DIAGNOSIS — I2510 Atherosclerotic heart disease of native coronary artery without angina pectoris: Secondary | ICD-10-CM | POA: Diagnosis not present

## 2023-04-21 MED ORDER — SACUBITRIL-VALSARTAN 97-103 MG PO TABS: 1.0000 | ORAL_TABLET | Freq: Two times a day (BID) | ORAL | 1 refills | Status: DC

## 2023-04-21 NOTE — Patient Instructions (Signed)
Medication Instructions:  Increase Entresto to 97/103 mg ( ! Tablet Twice Daily to Begin April 2025) *If you need a refill on your cardiac medications before your next appointment, please call your pharmacy*   Lab Work: No labs If you have labs (blood work) drawn today and your tests are completely normal, you will receive your results only by: MyChart Message (if you have MyChart) OR A paper copy in the mail If you have any lab test that is abnormal or we need to change your treatment, we will call you to review the results.   Testing/Procedures: [address] , [address]. August 2025 Your physician has requested that you have an echocardiogram. Echocardiography is a painless test that uses sound waves to create images of your heart. It provides your doctor with information about the size and shape of your heart and how well your heart's chambers and valves are working. This procedure takes approximately one hour. There are no restrictions for this procedure. Please do NOT wear cologne, perfume, aftershave, or lotions (deodorant is allowed). Please arrive 15 minutes prior to your appointment time.  Please note: We ask at that you not bring children with you during ultrasound (echo/ vascular) testing. Due to room size and safety concerns, children are not allowed in the ultrasound rooms during exams. Our front office staff cannot provide observation of children in our lobby area while testing is being conducted. An adult accompanying a patient to their appointment will only be allowed in the ultrasound room at the discretion of the ultrasound technician under special circumstances. We apologize for any inconvenience.    Follow-Up: At [facility], you and your health needs are our priority.  As part of our continuing mission to provide you with exceptional heart care, we have created designated Provider Care Teams.  These Care Teams include your primary Cardiologist  (physician) and Advanced Practice Providers (APPs -  Physician Assistants and Nurse Practitioners) who all work together to provide you with the care you need, when you need it.  We recommend signing up for the patient portal called "MyChart".  Sign up information is provided on this After Visit Summary.  MyChart is used to connect with patients for Virtual Visits (Telemedicine).  Patients are able to view lab/test results, encounter notes, upcoming appointments, etc.  Non-urgent messages can be sent to your provider as well.   To learn more about what you can do with MyChart, go to [email or web address].    Your next appointment:   June 2025  Provider:   Rollene Rotunda, MD    Other Instructions

## 2023-04-27 ENCOUNTER — Ambulatory Visit: Payer: BC Managed Care – PPO | Admitting: Physician Assistant

## 2023-05-06 DIAGNOSIS — S93402A Sprain of unspecified ligament of left ankle, initial encounter: Secondary | ICD-10-CM | POA: Diagnosis not present

## 2023-05-20 ENCOUNTER — Other Ambulatory Visit: Payer: BC Managed Care – PPO

## 2023-05-20 DIAGNOSIS — D649 Anemia, unspecified: Secondary | ICD-10-CM | POA: Diagnosis not present

## 2023-05-20 DIAGNOSIS — D631 Anemia in chronic kidney disease: Secondary | ICD-10-CM | POA: Diagnosis not present

## 2023-05-20 DIAGNOSIS — N189 Chronic kidney disease, unspecified: Secondary | ICD-10-CM | POA: Diagnosis not present

## 2023-05-20 DIAGNOSIS — E211 Secondary hyperparathyroidism, not elsewhere classified: Secondary | ICD-10-CM | POA: Diagnosis not present

## 2023-05-20 DIAGNOSIS — R809 Proteinuria, unspecified: Secondary | ICD-10-CM | POA: Diagnosis not present

## 2023-05-27 DIAGNOSIS — S93402A Sprain of unspecified ligament of left ankle, initial encounter: Secondary | ICD-10-CM | POA: Diagnosis not present

## 2023-05-29 DIAGNOSIS — N1832 Chronic kidney disease, stage 3b: Secondary | ICD-10-CM | POA: Diagnosis not present

## 2023-05-29 DIAGNOSIS — E1129 Type 2 diabetes mellitus with other diabetic kidney complication: Secondary | ICD-10-CM | POA: Diagnosis not present

## 2023-05-29 DIAGNOSIS — I5042 Chronic combined systolic (congestive) and diastolic (congestive) heart failure: Secondary | ICD-10-CM | POA: Diagnosis not present

## 2023-05-29 DIAGNOSIS — E1122 Type 2 diabetes mellitus with diabetic chronic kidney disease: Secondary | ICD-10-CM | POA: Diagnosis not present

## 2023-06-02 ENCOUNTER — Telehealth: Payer: Self-pay | Admitting: Nurse Practitioner

## 2023-06-02 NOTE — Telephone Encounter (Signed)
 Apt scheduled  Copied from CRM [id number]. Topic: Appointments - Scheduling Inquiry for Clinic >> Jun 02, 2023  8:30 AM Izetta Dakin wrote: Reason for CRM: Patient requesting to have labs scheduled for the week of Jul 20, 2023.

## 2023-06-11 ENCOUNTER — Ambulatory Visit (HOSPITAL_COMMUNITY)
Admission: RE | Admit: 2023-06-11 | Discharge: 2023-06-11 | Disposition: A | Discharge status: Home / Self Care | Source: Ambulatory Visit | Attending: Nephrology | Admitting: Nephrology

## 2023-06-11 DIAGNOSIS — E1122 Type 2 diabetes mellitus with diabetic chronic kidney disease: Secondary | ICD-10-CM | POA: Diagnosis not present

## 2023-06-11 DIAGNOSIS — N1832 Chronic kidney disease, stage 3b: Secondary | ICD-10-CM | POA: Diagnosis not present

## 2023-06-11 DIAGNOSIS — I5042 Chronic combined systolic (congestive) and diastolic (congestive) heart failure: Secondary | ICD-10-CM | POA: Insufficient documentation

## 2023-06-11 DIAGNOSIS — N2889 Other specified disorders of kidney and ureter: Secondary | ICD-10-CM | POA: Diagnosis not present

## 2023-06-11 DIAGNOSIS — N281 Cyst of kidney, acquired: Secondary | ICD-10-CM | POA: Diagnosis not present

## 2023-06-11 DIAGNOSIS — N183 Chronic kidney disease, stage 3 unspecified: Secondary | ICD-10-CM | POA: Diagnosis not present

## 2023-07-21 ENCOUNTER — Other Ambulatory Visit

## 2023-07-21 DIAGNOSIS — D631 Anemia in chronic kidney disease: Secondary | ICD-10-CM | POA: Diagnosis not present

## 2023-07-21 DIAGNOSIS — R809 Proteinuria, unspecified: Secondary | ICD-10-CM | POA: Diagnosis not present

## 2023-07-21 DIAGNOSIS — N189 Chronic kidney disease, unspecified: Secondary | ICD-10-CM | POA: Diagnosis not present

## 2023-08-04 DIAGNOSIS — N1832 Chronic kidney disease, stage 3b: Secondary | ICD-10-CM | POA: Diagnosis not present

## 2023-08-04 DIAGNOSIS — E1129 Type 2 diabetes mellitus with other diabetic kidney complication: Secondary | ICD-10-CM | POA: Diagnosis not present

## 2023-08-04 DIAGNOSIS — I5042 Chronic combined systolic (congestive) and diastolic (congestive) heart failure: Secondary | ICD-10-CM | POA: Diagnosis not present

## 2023-08-04 DIAGNOSIS — E1122 Type 2 diabetes mellitus with diabetic chronic kidney disease: Secondary | ICD-10-CM | POA: Diagnosis not present

## 2023-08-16 NOTE — Progress Notes (Unsigned)
 Cardiology Office Note:   Date:  08/17/2023  ID:  Ian Stewart, DOB 1970-09-11, MRN [id number] PCP: Delfina Feller, FNP  [facility] Providers Cardiologist:  Eilleen Grates, MD {  History of Present Illness:   Ian Stewart is a 53 y.o. male who presents for who was referred by Delfina Feller, FNP for evaluation of  bradycardia.  This was noted on his Apple Watch.  He has had some chest pain and had a negative POET (Plain Old Exercise Treadmill) in 2019.  Echocardiogram in January 2021 demonstrated a mildly reduced ejection fraction 45 to 50%.  He returns for follow-up.  Echo in Jan 2025 demonstrated again the EF to be at 45% there were no significant valvular abnormalities.    He returns for follow up.  At the last visit he had his Entresto  increased.  ***   *** Since I last saw him he had chest pain.  He was going to have a PET but he could not afford the copay.  He was also supposed to get a home sleep study but again the co-pay was too high.  He has an active job doing a lot of walking.  He says he is actually feeling well.  He denies any chest discomfort, neck or arm discomfort.  He says he is not getting any shortness of breath, PND or orthopnea.  He has no palpitations, presyncope or syncope.  He has had no weight gain or loss.  He has had no edema.     1. Left ventricular ejection fraction, by estimation, is 40 to 45%. Left  ventricular ejection fraction by 3D volume is 42 %. The left ventricle has  mildly decreased function. The left ventricle has no regional wall motion  abnormalities. There is  moderate concentric left ventricular hypertrophy. Left ventricular  diastolic parameters are consistent with Grade I diastolic dysfunction  (impaired relaxation).   2. Right ventricular systolic function is normal. The right ventricular  size is normal. Tricuspid regurgitation signal is inadequate for assessing  PA pressure.   3. The mitral valve is normal in structure. No  evidence of mitral valve  regurgitation. No evidence of mitral stenosis.   4. The aortic valve is normal in structure. Aortic valve regurgitation is  mild. No aortic stenosis is present.   5. There is borderline dilatation of the aortic root, measuring 37 mm.  There is borderline dilatation of the ascending aorta, measuring 37 mm.   6. The inferior vena cava is normal in size with greater than 50%  respiratory variability, suggesting right atrial pressure of 3 mmHg.    ROS: ***  Studies Reviewed:    EKG:       ***  Risk Assessment/Calculations:   {Does this patient have ATRIAL FIBRILLATION?[phone]} No BP recorded.  {Refresh Note OR Click here to enter BP  :1}***   STOP-Bang Score:     { Consider Dx Sleep Disordered Breathing or Sleep Apnea  ICD G47.33          :1}     Physical Exam:   VS:  There were no vitals taken for this visit.   Wt Readings from Last 3 Encounters:  04/21/23 (!) 313 lb (142 kg)  04/02/23 (!) 309 lb (140.2 kg)  02/19/23 (!) 309 lb (140.2 kg)     GEN: Well nourished, well developed in no acute distress NECK: No JVD; No carotid bruits CARDIAC: ***RR, *** murmurs, rubs, gallops RESPIRATORY:  Clear to auscultation without rales, wheezing or rhonchi  ABDOMEN: Soft, non-tender, non-distended EXTREMITIES:  No edema; No deformity   ASSESSMENT AND PLAN:   HTN:    This is being managed in the context of treating his CHF *** n this.  He is having his meds titrated by his nephrologist and just had Farxiga and spironolactone  added.  I will defer any change for now.    CARDIOMYOPATHY:   *** Will follow-up on an echocardiogram.  If his ejection fraction is in the 45% or lower range I probably switch to Entresto .    BRADYCARDIA:   ***   He has nighttime bradycardia no symptoms.  No change in therapy.   DYSLIPIDEMIA: LDL was *** excellent recently.  He had it checked at work.  He is at [employer].  However his triglycerides were 300 and we talked about low  carbohydrate.    OBESITY:   ***  As above we talked about diet.   CKD: His creatinine last was *** 2.1 and he is followed by nephrology.         Follow up ***  Signed, Eilleen Grates, MD

## 2023-08-19 ENCOUNTER — Encounter: Payer: Self-pay | Admitting: Cardiology

## 2023-08-19 ENCOUNTER — Ambulatory Visit: Payer: BC Managed Care – PPO | Attending: Cardiovascular Disease | Admitting: Cardiology

## 2023-08-19 VITALS — BP 142/84 | Ht 73.0 in | Wt 311.0 lb

## 2023-08-19 DIAGNOSIS — I420 Dilated cardiomyopathy: Secondary | ICD-10-CM

## 2023-08-19 DIAGNOSIS — R0602 Shortness of breath: Secondary | ICD-10-CM

## 2023-08-19 DIAGNOSIS — N1831 Chronic kidney disease, stage 3a: Secondary | ICD-10-CM | POA: Diagnosis not present

## 2023-08-19 DIAGNOSIS — I2510 Atherosclerotic heart disease of native coronary artery without angina pectoris: Secondary | ICD-10-CM

## 2023-08-19 DIAGNOSIS — E785 Hyperlipidemia, unspecified: Secondary | ICD-10-CM | POA: Diagnosis not present

## 2023-08-19 DIAGNOSIS — I10 Essential (primary) hypertension: Secondary | ICD-10-CM

## 2023-08-19 NOTE — Patient Instructions (Addendum)
 Medication Instructions:  Your physician recommends that you continue on your current medications as directed. Please refer to the Current Medication list given to you today.  *If you need a refill on your cardiac medications before your next appointment, please call your pharmacy*  Lab Work: NONE If you have labs (blood work) drawn today and your tests are completely normal, you will receive your results only by: MyChart Message (if you have MyChart) OR A paper copy in the mail If you have any lab test that is abnormal or we need to change your treatment, we will call you to review the results.  Testing/Procedures: PET/CT Stress Test  Follow-Up: At [facility], you and your health needs are our priority.  As part of our continuing mission to provide you with exceptional heart care, our providers are all part of one team.  This team includes your primary Cardiologist (physician) and Advanced Practice Providers or APPs (Physician Assistants and Nurse Practitioners) who all work together to provide you with the care you need, when you need it.  Your next appointment:   6 month(s)  Provider:   Eilleen Grates, MD    We recommend signing up for the patient portal called MyChart.  Sign up information is provided on this After Visit Summary.  MyChart is used to connect with patients for Virtual Visits (Telemedicine).  Patients are able to view lab/test results, encounter notes, upcoming appointments, etc.  Non-urgent messages can be sent to your provider as well.   To learn more about what you can do with MyChart, go to [email or web address].   Other Instructions    Please report to Radiology at the [facility] Main Entrance 30 minutes early for your test.  [address] [place], [place] [address]     How to Prepare for Your Cardiac PET/CT Stress Test:  Nothing to eat or drink, except water, 3 hours prior to arrival time.  NO caffeine/decaffeinated products,  or chocolate 12 hours prior to arrival. (Please note decaffeinated beverages (teas/coffees) still contain caffeine).  If you have caffeine within 12 hours prior, the test will need to be rescheduled.  Medication instructions: Do not take erectile dysfunction medications for 72 hours prior to test (sildenafil, tadalafil) Do not take nitrates (isosorbide mononitrate, Ranexa) the day before or day of test Do not take tamsulosin  the day before or morning of test Hold theophylline containing medications for 12 hours. Hold Dipyridamole 48 hours prior to the test.  Diabetic Preparation: If able to eat breakfast prior to 3 hour fasting, you may take all medications, including your insulin. Do not worry if you miss your breakfast dose of insulin - start at your next meal. If you do not eat prior to 3 hour fast-Hold all diabetes (oral and insulin) medications. Patients who wear a continuous glucose monitor MUST remove the device prior to scanning.  You may take your remaining medications with water.  NO perfume, cologne or lotion on chest or abdomen area. FEMALES - Please avoid wearing dresses to this appointment.  Total time is 1 to 2 hours; you may want to bring reading material for the waiting time.  IF YOU THINK YOU MAY BE PREGNANT, OR ARE NURSING PLEASE INFORM THE TECHNOLOGIST.  In preparation for your appointment, medication and supplies will be purchased.  Appointment availability is limited, so if you need to cancel or reschedule, please call the Radiology Department Scheduler at [phone] 24 hours in advance to avoid a cancellation fee of $100.00  What to Expect When you Arrive:  Once you arrive and check in for your appointment, you will be taken to a preparation room within the Radiology Department.  A technologist or Nurse will obtain your medical history, verify that you are correctly prepped for the exam, and explain the procedure.  Afterwards, an IV will be started in your arm and  electrodes will be placed on your skin for EKG monitoring during the stress portion of the exam. Then you will be escorted to the PET/CT scanner.  There, staff will get you positioned on the scanner and obtain a blood pressure and EKG.  During the exam, you will continue to be connected to the EKG and blood pressure machines.  A small, safe amount of a radioactive tracer will be injected in your IV to obtain a series of pictures of your heart along with an injection of a stress agent.    After your Exam:  It is recommended that you eat a meal and drink a caffeinated beverage to counter act any effects of the stress agent.  Drink plenty of fluids for the remainder of the day and urinate frequently for the first couple of hours after the exam.  Your doctor will inform you of your test results within 7-10 business days.  For more information and frequently asked questions, please visit our website: [email or web address]  For questions about your test or how to prepare for your test, please call: Cardiac Imaging Nurse Navigators Office: [phone]

## 2023-08-24 ENCOUNTER — Other Ambulatory Visit: Payer: Self-pay | Admitting: Nurse Practitioner

## 2023-08-24 DIAGNOSIS — I10 Essential (primary) hypertension: Secondary | ICD-10-CM

## 2023-08-31 ENCOUNTER — Other Ambulatory Visit (HOSPITAL_COMMUNITY): Payer: Self-pay | Admitting: Nurse Practitioner

## 2023-08-31 DIAGNOSIS — E1122 Type 2 diabetes mellitus with diabetic chronic kidney disease: Secondary | ICD-10-CM

## 2023-08-31 DIAGNOSIS — I5042 Chronic combined systolic (congestive) and diastolic (congestive) heart failure: Secondary | ICD-10-CM

## 2023-08-31 DIAGNOSIS — R809 Proteinuria, unspecified: Secondary | ICD-10-CM

## 2023-08-31 DIAGNOSIS — N1832 Chronic kidney disease, stage 3b: Secondary | ICD-10-CM

## 2023-09-15 ENCOUNTER — Other Ambulatory Visit (HOSPITAL_COMMUNITY): Payer: Self-pay | Admitting: Student

## 2023-09-15 DIAGNOSIS — N1832 Chronic kidney disease, stage 3b: Secondary | ICD-10-CM

## 2023-09-16 ENCOUNTER — Encounter (HOSPITAL_COMMUNITY): Payer: Self-pay

## 2023-09-16 ENCOUNTER — Ambulatory Visit (HOSPITAL_COMMUNITY)
Admission: RE | Admit: 2023-09-16 | Discharge: 2023-09-16 | Disposition: A | Discharge status: Home / Self Care | Source: Ambulatory Visit | Attending: Nurse Practitioner | Admitting: Nurse Practitioner

## 2023-09-16 ENCOUNTER — Other Ambulatory Visit: Payer: Self-pay

## 2023-09-16 DIAGNOSIS — N1832 Chronic kidney disease, stage 3b: Secondary | ICD-10-CM | POA: Insufficient documentation

## 2023-09-16 DIAGNOSIS — E1122 Type 2 diabetes mellitus with diabetic chronic kidney disease: Secondary | ICD-10-CM | POA: Diagnosis not present

## 2023-09-16 DIAGNOSIS — N189 Chronic kidney disease, unspecified: Secondary | ICD-10-CM | POA: Diagnosis not present

## 2023-09-16 DIAGNOSIS — R809 Proteinuria, unspecified: Secondary | ICD-10-CM | POA: Insufficient documentation

## 2023-09-16 DIAGNOSIS — I129 Hypertensive chronic kidney disease with stage 1 through stage 4 chronic kidney disease, or unspecified chronic kidney disease: Secondary | ICD-10-CM | POA: Insufficient documentation

## 2023-09-16 DIAGNOSIS — I5042 Chronic combined systolic (congestive) and diastolic (congestive) heart failure: Secondary | ICD-10-CM

## 2023-09-16 DIAGNOSIS — N186 End stage renal disease: Secondary | ICD-10-CM | POA: Diagnosis not present

## 2023-09-16 LAB — PROTIME-INR
INR: 0.9 (ref 0.8–1.2)
Prothrombin Time: 13 s (ref 11.4–15.2)

## 2023-09-16 LAB — CBC
HCT: 41.4 % (ref 39.0–52.0)
Hemoglobin: 13.7 g/dL (ref 13.0–17.0)
MCH: 30 pg (ref 26.0–34.0)
MCHC: 33.1 g/dL (ref 30.0–36.0)
MCV: 90.6 fL (ref 80.0–100.0)
Platelets: 247 K/uL (ref 150–400)
RBC: 4.57 MIL/uL (ref 4.22–5.81)
RDW: 14.1 % (ref 11.5–15.5)
WBC: 5.2 K/uL (ref 4.0–10.5)
nRBC: 0 % (ref 0.0–0.2)

## 2023-09-16 MED ORDER — LIDOCAINE HCL (PF) 1 % IJ SOLN
10.0000 mL | Freq: Once | INTRAMUSCULAR | Status: AC
  Administered 2023-09-16: 10 mL via INTRADERMAL

## 2023-09-16 MED ORDER — HYDROCODONE-ACETAMINOPHEN 5-325 MG PO TABS: 1.0000 | ORAL_TABLET | ORAL | Status: DC | PRN

## 2023-09-16 MED ORDER — SODIUM CHLORIDE 0.9 % IV SOLN: INTRAVENOUS | Status: DC

## 2023-09-16 MED ORDER — MIDAZOLAM HCL 2 MG/2ML IJ SOLN
INTRAMUSCULAR | Status: AC
  Filled 2023-09-16: qty 2

## 2023-09-16 MED ORDER — FENTANYL CITRATE (PF) 100 MCG/2ML IJ SOLN
INTRAMUSCULAR | Status: AC
  Filled 2023-09-16: qty 2

## 2023-09-16 MED ORDER — ACETAMINOPHEN 325 MG PO TABS: 650.0000 mg | ORAL_TABLET | ORAL | Status: DC | PRN

## 2023-09-16 MED ORDER — HYDRALAZINE HCL 20 MG/ML IJ SOLN
INTRAMUSCULAR | Status: AC
Start: 2023-09-16 — End: 2023-09-16
  Filled 2023-09-16: qty 1

## 2023-09-16 MED ORDER — FENTANYL CITRATE (PF) 100 MCG/2ML IJ SOLN
INTRAMUSCULAR | Status: AC | PRN
  Administered 2023-09-16: 25 ug via INTRAVENOUS
  Administered 2023-09-16: 50 ug via INTRAVENOUS

## 2023-09-16 MED ORDER — GELATIN ABSORBABLE 12-7 MM EX MISC
1.0000 | Freq: Once | CUTANEOUS | Status: AC
  Administered 2023-09-16: 1 via TOPICAL

## 2023-09-16 MED ORDER — HYDRALAZINE HCL 20 MG/ML IJ SOLN: 10.0000 mg | Freq: Once | INTRAMUSCULAR | Status: DC

## 2023-09-16 MED ORDER — MIDAZOLAM HCL 2 MG/2ML IJ SOLN
INTRAMUSCULAR | Status: AC | PRN
Start: 2023-09-16 — End: 2023-09-16
  Administered 2023-09-16: 1 mg via INTRAVENOUS
  Administered 2023-09-16: .5 mg via INTRAVENOUS

## 2023-09-16 NOTE — Progress Notes (Signed)
 Spoke with Zane Boon, PA who stated that patient can resume ASA tomorrow if no bleeding occurs.

## 2023-09-16 NOTE — H&P (Addendum)
 Chief Complaint: Patient was seen in consultation today for chronic kidney disease  Referring Physician(s): Wise,Tiffany  Supervising Physician: Jennefer Rover  Patient Status: [facility] - Out-pt  History of Present Illness: Ian Stewart is a 53 y.o. male with past medical history of HLD, HTN, bradycardia, and chronic kidney disease followed by Dr. Rachele who was recently noted to have progressive decline in renal function.  He is referred to IR for medical renal biopsy.   Ian Stewart presents today in his usual state of health.  He has been NPO. He does not take blood thinners.  He does have post-procedure care and transportation arranged. He is understanding of the procedure today and is agreeable to proceed.   Past Medical History:  Diagnosis Date   Arrhythmia    BRADYCARDIA/ 41-50 BEATS   Chronic kidney disease    monitoring kidey function due to other medications   Hyperlipidemia    Hypertension     Past Surgical History:  Procedure Laterality Date   RETINAL DETACHMENT SURGERY      Allergies: Lipitor [atorvastatin]  Medications: Prior to Admission medications   Medication Sig Start Date End Date Taking? Authorizing Provider  acetaminophen  (TYLENOL ) 500 MG tablet Take 1,000 mg by mouth every 6 (six) hours as needed for mild pain or moderate pain.   Yes [provider]  amLODipine  (NORVASC ) 10 MG tablet TAKE 1 TABLET BY MOUTH DAILY 08/25/23  Yes Gladis, Mary-Margaret, FNP  Ascorbic Acid (VITAMIN C) 500 MG CAPS Take by mouth.   Yes [provider]  Cholecalciferol (DIALYVITE VITAMIN D 5000 PO) Take by mouth.   Yes [provider]  Coenzyme Q10 (CO Q 10 PO) Take by mouth.   Yes [provider]  FARXIGA 5 MG TABS tablet Take 5 mg by mouth daily.   Yes [provider]  fenofibrate  (TRICOR ) 145 MG tablet Take 1 tablet (145 mg total) by mouth daily. 04/02/23  Yes Gladis, Mary-Margaret, FNP  metoprolol  succinate (TOPROL -XL) 100 MG 24 hr  tablet TAKE 1 TABLET BY MOUTH DAILY  TAKE WITH OR IMMEDIATELY  FOLLOWING A MEAL IN THE MORNING 04/02/23  Yes Gladis, Mary-Margaret, FNP  Multiple Vitamin (MULTIVITAMIN) capsule Take 1 capsule by mouth every morning.    Yes [provider]  Multiple Vitamins-Minerals (ZINC PO) Take by mouth.   Yes [provider]  rosuvastatin  (CRESTOR ) 40 MG tablet TAKE 1 TABLET BY MOUTH DAILY 02/25/23  Yes Lavona Agent, MD  sacubitril -valsartan  (ENTRESTO ) 97-103 MG Take 1 tablet by mouth 2 (two) times daily. 04/21/23  Yes Jerilynn Lamarr HERO, NP  spironolactone  (ALDACTONE ) 25 MG tablet Take 0.5 tablets (12.5 mg total) by mouth daily. 04/02/23  Yes Martin, Mary-Margaret, FNP  aspirin  81 MG tablet Take 81 mg by mouth daily.    [provider]  colchicine  0.6 MG tablet 2 tablets PO at gout onswet, may repeat 1x in 1 hour. No more then 3 per day. 09/06/20   Gladis, Mary-Margaret, FNP  fluticasone (FLONASE) 50 MCG/ACT nasal spray Place 2 sprays into both nostrils every morning.  06/23/14   [provider]     Family History  Problem Relation Age of Onset   CAD Mother 27   CAD Father 72       CABG, died age 49   Colon cancer Neg Hx    Esophageal cancer Neg Hx    Stomach cancer Neg Hx    Rectal cancer Neg Hx     Social History   Socioeconomic History  Marital status: Single    Spouse name: Not on file   Number of children: Not on file   Years of education: Not on file   Highest education level: Associate degree: academic program  Occupational History   Not on file  Tobacco Use   Smoking status: Never   Smokeless tobacco: Never  Vaping Use   Vaping status: Never Used  Substance and Sexual Activity   Alcohol use: No   Drug use: No   Sexual activity: Not on file  Other Topics Concern   Not on file  Social History Narrative   Works at [employer] and lives alone.     Social Drivers of Corporate investment banker Strain: Low Risk  (04/02/2023)   Overall Financial  Resource Strain (CARDIA)    Difficulty of Paying Living Expenses: Not hard at all  Food Insecurity: No Food Insecurity (04/02/2023)   Hunger Vital Sign    Worried About Running Out of Food in the Last Year: Never true    Ran Out of Food in the Last Year: Never true  Transportation Needs: No Transportation Needs (04/02/2023)   PRAPARE - Administrator, Civil Service (Medical): No    Lack of Transportation (Non-Medical): No  Physical Activity: Sufficiently Active (04/02/2023)   Exercise Vital Sign    Days of Exercise per Week: 5 days    Minutes of Exercise per Session: 30 min  Stress: No Stress Concern Present (04/02/2023)   [employer] of Occupational Health - Occupational Stress Questionnaire    Feeling of Stress : Only a little  Social Connections: Moderately Isolated (04/02/2023)   Social Connection and Isolation Panel    Frequency of Communication with Friends and Family: Once a week    Frequency of Social Gatherings with Friends and Family: Once a week    Attends Religious Services: More than 4 times per year    Active Member of [employer] or Organizations: Yes    Attends Engineer, structural: More than 4 times per year    Marital Status: Never married     Review of Systems: A 12 point ROS discussed and pertinent positives are indicated in the HPI above.  All other systems are negative.  Review of Systems  Constitutional:  Negative for fatigue and fever.  Respiratory:  Negative for cough and shortness of breath.   Cardiovascular:  Negative for chest pain.  Gastrointestinal:  Negative for abdominal pain, nausea and vomiting.  Musculoskeletal:  Negative for back pain.  Psychiatric/Behavioral:  Negative for behavioral problems and confusion.     Vital Signs: BP (!) 150/98   Pulse (!) 59   Temp 98.3 F (36.8 C) (Oral)   Resp 18   Ht 6' 1 (1.854 m)   Wt (!) 310 lb (140.6 kg)   SpO2 95%   BMI 40.90 kg/m   Physical Exam Vitals and nursing note  reviewed.  Constitutional:      General: He is not in acute distress.    Appearance: Normal appearance. He is not ill-appearing.  HENT:     Mouth/Throat:     Mouth: Mucous membranes are moist.     Pharynx: Oropharynx is clear.  Cardiovascular:     Rate and Rhythm: Normal rate and regular rhythm.  Pulmonary:     Effort: Pulmonary effort is normal.     Breath sounds: Normal breath sounds.  Abdominal:     General: Abdomen is flat. There is no distension.     Palpations:  Abdomen is soft.  Skin:    General: Skin is warm and dry.  Neurological:     General: No focal deficit present.     Mental Status: He is alert and oriented to person, place, and time. Mental status is at baseline.  Psychiatric:        Mood and Affect: Mood normal.        Behavior: Behavior normal.        Thought Content: Thought content normal.        Judgment: Judgment normal.      MD Evaluation Airway: WNL Heart: WNL Abdomen: WNL Chest/ Lungs: WNL ASA  Classification: 3 Mallampati/Airway Score: Two   Imaging: No results found.  Labs:  CBC: Recent Labs    09/18/22 0809 04/02/23 0925 09/16/23 0620  WBC 6.2 5.5 5.2  HGB 13.3 13.8 13.7  HCT 40.0 41.4 41.4  PLT 240 228 247    COAGS: Recent Labs    09/16/23 0620  INR 0.9    BMP: Recent Labs    09/18/22 0809 04/02/23 0925  NA 140 141  K 4.0 4.5  CL 101 102  CO2 23 23  GLUCOSE 119* 124*  BUN 42* 48*  CALCIUM  9.8 10.2  CREATININE 2.10* 2.85*    LIVER FUNCTION TESTS: Recent Labs    09/18/22 0809 04/02/23 0925  BILITOT 0.3 0.3  AST 27 31  ALT 24 29  ALKPHOS 43* 35*  PROT 6.8 6.9  ALBUMIN 4.4 4.4    TUMOR MARKERS: No results for input(s): AFPTM, CEA, CA199, CHROMGRNA in the last 8760 hours.  Assessment and Plan: Patient with past medical history of HTN, chronic kidney disease presents with complaint of progressive renal function decline.  IR consulted for renal biopsy at the request of Dr. Rachele. Case  reviewed by Dr. Jennefer who approves patient for procedure.  Patient presents today in their usual state of health.  He has been NPO and is not currently on blood thinners.  Patient is FULL CODE.  BP on arrival 158/98 which patient reports is elevated for him.  He does take blood pressure medication and did take overnight.  Recheck BP and plan to give hydralazine  10mg  IV if remains elevated.  Risks and benefits of renal biopsy was discussed with the patient and/or patient's family including, but not limited to bleeding, infection, damage to adjacent structures or low yield requiring additional tests.  All of the questions were answered and there is agreement to proceed.  Consent signed and in chart.    Thank you for this interesting consult.  I greatly enjoyed meeting Ian Stewart and look forward to participating in their care.  A copy of this report was sent to the requesting provider on this date.  Electronically Signed: Gordana Kewley Sue-Ellen Godson Pollan, PA 09/16/2023, 7:42 AM   I spent a total of  30 Minutes   in face to face in clinical consultation, greater than 50% of which was counseling/coordinating care for chronic kidney disease.

## 2023-09-18 ENCOUNTER — Encounter (HOSPITAL_COMMUNITY): Payer: Self-pay

## 2023-09-22 ENCOUNTER — Other Ambulatory Visit

## 2023-09-22 ENCOUNTER — Encounter (HOSPITAL_COMMUNITY)
Admission: RE | Admit: 2023-09-22 | Discharge: 2023-09-22 | Disposition: A | Discharge status: Home / Self Care | Source: Ambulatory Visit | Attending: Cardiology | Admitting: Cardiology

## 2023-09-22 ENCOUNTER — Ambulatory Visit: Payer: Self-pay | Admitting: Cardiology

## 2023-09-22 DIAGNOSIS — I420 Dilated cardiomyopathy: Secondary | ICD-10-CM | POA: Diagnosis not present

## 2023-09-22 DIAGNOSIS — D631 Anemia in chronic kidney disease: Secondary | ICD-10-CM | POA: Diagnosis not present

## 2023-09-22 DIAGNOSIS — N189 Chronic kidney disease, unspecified: Secondary | ICD-10-CM | POA: Diagnosis not present

## 2023-09-22 DIAGNOSIS — R809 Proteinuria, unspecified: Secondary | ICD-10-CM | POA: Diagnosis not present

## 2023-09-22 LAB — NM PET CT CARDIAC PERFUSION MULTI W/ABSOLUTE BLOODFLOW
LV dias vol: 162 mL (ref 62–150)
LV sys vol: 97 mL (ref 4.2–5.8)
MBFR: 3.16
Nuc Rest EF: 40 %
Nuc Stress EF: 52 %
Rest MBF: 0.76 ml/g/min
Rest Nuclear Isotope Dose: 29.8 mCi
ST Depression (mm): 0 mm
Stress MBF: 2.4 ml/g/min
Stress Nuclear Isotope Dose: 29.9 mCi

## 2023-09-22 MED ORDER — REGADENOSON 0.4 MG/5ML IV SOLN
0.4000 mg | Freq: Once | INTRAVENOUS | Status: AC
  Administered 2023-09-22: 0.4 mg via INTRAVENOUS

## 2023-09-22 MED ORDER — RUBIDIUM RB82 GENERATOR (RUBYFILL)
29.7500 | PACK | Freq: Once | INTRAVENOUS | Status: AC
  Administered 2023-09-22: 29.75 via INTRAVENOUS

## 2023-09-22 MED ORDER — RUBIDIUM RB82 GENERATOR (RUBYFILL)
29.8600 | PACK | Freq: Once | INTRAVENOUS | Status: AC
  Administered 2023-09-22: 29.86 via INTRAVENOUS

## 2023-09-22 MED ORDER — REGADENOSON 0.4 MG/5ML IV SOLN
INTRAVENOUS | Status: AC
  Filled 2023-09-22: qty 5

## 2023-09-25 ENCOUNTER — Encounter (HOSPITAL_COMMUNITY): Payer: Self-pay

## 2023-09-25 LAB — SURGICAL PATHOLOGY

## 2023-09-30 ENCOUNTER — Encounter: Payer: Self-pay | Admitting: Cardiology

## 2023-10-02 DIAGNOSIS — N1832 Chronic kidney disease, stage 3b: Secondary | ICD-10-CM | POA: Diagnosis not present

## 2023-10-02 DIAGNOSIS — I5042 Chronic combined systolic (congestive) and diastolic (congestive) heart failure: Secondary | ICD-10-CM | POA: Diagnosis not present

## 2023-10-02 DIAGNOSIS — E1122 Type 2 diabetes mellitus with diabetic chronic kidney disease: Secondary | ICD-10-CM | POA: Diagnosis not present

## 2023-10-02 DIAGNOSIS — E1129 Type 2 diabetes mellitus with other diabetic kidney complication: Secondary | ICD-10-CM | POA: Diagnosis not present

## 2023-10-06 ENCOUNTER — Ambulatory Visit: Payer: BC Managed Care – PPO | Admitting: Nurse Practitioner

## 2023-10-06 VITALS — BP 128/84 | HR 53 | Temp 98.1°F | Ht 73.0 in | Wt 305.0 lb

## 2023-10-06 DIAGNOSIS — E785 Hyperlipidemia, unspecified: Secondary | ICD-10-CM | POA: Diagnosis not present

## 2023-10-06 DIAGNOSIS — I10 Essential (primary) hypertension: Secondary | ICD-10-CM | POA: Diagnosis not present

## 2023-10-06 DIAGNOSIS — E8881 Metabolic syndrome: Secondary | ICD-10-CM

## 2023-10-06 DIAGNOSIS — Z0001 Encounter for general adult medical examination with abnormal findings: Secondary | ICD-10-CM

## 2023-10-06 DIAGNOSIS — Z0000 Encounter for general adult medical examination without abnormal findings: Secondary | ICD-10-CM

## 2023-10-06 DIAGNOSIS — R001 Bradycardia, unspecified: Secondary | ICD-10-CM

## 2023-10-06 DIAGNOSIS — N1831 Chronic kidney disease, stage 3a: Secondary | ICD-10-CM

## 2023-10-06 LAB — BAYER DCA HB A1C WAIVED: HB A1C (BAYER DCA - WAIVED): 5.7 % — ABNORMAL HIGH (ref 4.8–5.6)

## 2023-10-06 LAB — LIPID PANEL

## 2023-10-06 MED ORDER — FENOFIBRATE 145 MG PO TABS: 145.0000 mg | ORAL_TABLET | Freq: Every day | ORAL | 1 refills | Status: DC

## 2023-10-06 MED ORDER — ROSUVASTATIN CALCIUM 40 MG PO TABS: 40.0000 mg | ORAL_TABLET | Freq: Every day | ORAL | 1 refills | Status: DC

## 2023-10-06 MED ORDER — AMLODIPINE BESYLATE 10 MG PO TABS: 10.0000 mg | ORAL_TABLET | Freq: Every day | ORAL | 1 refills | Status: DC

## 2023-10-06 MED ORDER — SPIRONOLACTONE 25 MG PO TABS: 12.5000 mg | ORAL_TABLET | Freq: Every day | ORAL | 1 refills | Status: DC

## 2023-10-06 MED ORDER — METOPROLOL SUCCINATE ER 100 MG PO TB24: ORAL_TABLET | ORAL | 1 refills | Status: DC

## 2023-10-06 MED ORDER — SACUBITRIL-VALSARTAN 97-103 MG PO TABS: 1.0000 | ORAL_TABLET | Freq: Two times a day (BID) | ORAL | 1 refills | Status: DC

## 2023-10-06 NOTE — Patient Instructions (Signed)

## 2023-10-06 NOTE — Progress Notes (Signed)
 Subjective:    Patient ID: Ian Stewart, male    DOB: 13-Oct-1970, 53 y.o.   MRN: [id number]   Chief Complaint: annual physical     HPI:  Ian Stewart is a 53 y.o. who identifies as a male who was assigned male at birth.   Social history: Lives with: by hisself Work history: [employer]   Comes in today for follow up of the following chronic medical issues:  1. Essential hypertension No c/o chest pain, sob or headache. Does not check blood pressure at home. BP Readings from Last 3 Encounters:  09/22/23 122/73  09/16/23 125/84  08/19/23 (!) 142/84     2. Hyperlipidemia with target LDL less than 100 Does not watch diet and does no dedicated exercise. Lab Results  Component Value Date   CHOL 178 04/02/2023   HDL 28 (L) 04/02/2023   LDLCALC 96 04/02/2023   TRIG 322 (H) 04/02/2023   CHOLHDL 6.4 (H) 04/02/2023   The 10-year ASCVD risk score (Arnett DK, et al., 2019) is: 16.2%   3. Chronic kidney disease (CKD) stage G3a/A1, moderately decreased glomerular filtration rate (GFR) between 45-59 mL/min/1.73 square meter and albuminuria creatinine ratio less than 30 mg/g (HCC) Sees nephrology every 3 months. Had follow up last week. No changes made to plan of care. Lab Results  Component Value Date   CREATININE 2.85 (H) 04/02/2023     4. Bradycardia No syncopal or near syncopal episodes.   5. Metabolic syndrome Does not check blood sugars at home. Lab Results  Component Value Date   HGBA1C 6.1 (H) 04/02/2023     6. Morbid obesity (HCC) Weight is down 5 lbs  Wt Readings from Last 3 Encounters:  10/06/23 (!) 305 lb (138.3 kg)  09/16/23 (!) 310 lb (140.6 kg)  08/19/23 (!) 311 lb (141.1 kg)   BMI Readings from Last 3 Encounters:  10/06/23 40.24 kg/m  09/16/23 40.90 kg/m  08/19/23 41.03 kg/m        New complaints: None today  Allergies  Allergen Reactions   Lipitor [Atorvastatin] Other (See Comments)    Muscle aches   Outpatient Encounter Medications  as of 10/06/2023  Medication Sig   acetaminophen  (TYLENOL ) 500 MG tablet Take 1,000 mg by mouth every 6 (six) hours as needed for mild pain or moderate pain.   amLODipine  (NORVASC ) 10 MG tablet TAKE 1 TABLET BY MOUTH DAILY   Ascorbic Acid (VITAMIN C) 500 MG CAPS Take by mouth.   aspirin  81 MG tablet Take 81 mg by mouth daily.   Cholecalciferol (DIALYVITE VITAMIN D 5000 PO) Take by mouth.   Coenzyme Q10 (CO Q 10 PO) Take by mouth.   colchicine  0.6 MG tablet 2 tablets PO at gout onswet, may repeat 1x in 1 hour. No more then 3 per day.   FARXIGA 5 MG TABS tablet Take 5 mg by mouth daily.   fenofibrate  (TRICOR ) 145 MG tablet Take 1 tablet (145 mg total) by mouth daily.   fluticasone (FLONASE) 50 MCG/ACT nasal spray Place 2 sprays into both nostrils every morning.    metoprolol  succinate (TOPROL -XL) 100 MG 24 hr tablet TAKE 1 TABLET BY MOUTH DAILY  TAKE WITH OR IMMEDIATELY  FOLLOWING A MEAL IN THE MORNING   Multiple Vitamin (MULTIVITAMIN) capsule Take 1 capsule by mouth every morning.    Multiple Vitamins-Minerals (ZINC PO) Take by mouth.   rosuvastatin  (CRESTOR ) 40 MG tablet TAKE 1 TABLET BY MOUTH DAILY   sacubitril -valsartan  (ENTRESTO ) 97-103 MG Take 1 tablet by  mouth 2 (two) times daily.   spironolactone  (ALDACTONE ) 25 MG tablet Take 0.5 tablets (12.5 mg total) by mouth daily.   No facility-administered encounter medications on file as of 10/06/2023.    Past Surgical History:  Procedure Laterality Date   RETINAL DETACHMENT SURGERY      Family History  Problem Relation Age of Onset   CAD Mother 29   CAD Father 75       CABG, died age 33   Colon cancer Neg Hx    Esophageal cancer Neg Hx    Stomach cancer Neg Hx    Rectal cancer Neg Hx       Controlled substance contract: n/a     Review of Systems  Constitutional:  Negative for diaphoresis.  Eyes:  Negative for pain.  Respiratory:  Negative for shortness of breath.   Cardiovascular:  Negative for chest pain, palpitations and  leg swelling.  Gastrointestinal:  Negative for abdominal pain.  Endocrine: Negative for polydipsia.  Skin:  Negative for rash.  Neurological:  Negative for dizziness, weakness and headaches.  Hematological:  Does not bruise/bleed easily.  All other systems reviewed and are negative.      Objective:   Physical Exam Vitals and nursing note reviewed.  Constitutional:      Appearance: Normal appearance. He is well-developed.  HENT:     Head: Normocephalic.     Nose: Nose normal.     Mouth/Throat:     Mouth: Mucous membranes are moist.     Pharynx: Oropharynx is clear.  Eyes:     Pupils: Pupils are equal, round, and reactive to light.  Neck:     Thyroid : No thyroid  mass or thyromegaly.     Vascular: No carotid bruit or JVD.     Trachea: Phonation normal.  Cardiovascular:     Rate and Rhythm: Normal rate and regular rhythm.  Pulmonary:     Effort: Pulmonary effort is normal. No respiratory distress.     Breath sounds: Normal breath sounds.  Abdominal:     General: Bowel sounds are normal.     Palpations: Abdomen is soft.     Tenderness: There is no abdominal tenderness.  Musculoskeletal:        General: Normal range of motion.     Cervical back: Normal range of motion and neck supple.  Lymphadenopathy:     Cervical: No cervical adenopathy.  Skin:    General: Skin is warm and dry.  Neurological:     Mental Status: He is alert and oriented to person, place, and time.  Psychiatric:        Behavior: Behavior normal.        Thought Content: Thought content normal.        Judgment: Judgment normal.    BP 128/84   Pulse (!) 53   Temp 98.1 F (36.7 C) (Temporal)   Ht 6' 1 (1.854 m)   Wt (!) 305 lb (138.3 kg)   SpO2 94%   BMI 40.24 kg/m     Hgba1c 6.1%      Assessment & Plan:  [name] comes in today with chief complaint of annual physical  Diagnosis and orders addressed:  1. Essential hypertension Low sodium diet - CBC with Differential/Platelet -  CMP14+EGFR - amLODipine  (NORVASC ) 10 MG tablet; Take 1 tablet (10 mg total) by mouth daily.  Dispense: 90 tablet; Refill: 1 - metoprolol  succinate (TOPROL -XL) 100 MG 24 hr tablet; TAKE 1 TABLET BY MOUTH DAILY  TAKE WITH  OR IMMEDIATELY  FOLLOWING A MEAL IN THE MORNING  Dispense: 90 tablet; Refill: 1 - valsartan -hydrochlorothiazide  (DIOVAN -HCT) 320-25 MG tablet; Take 1 tablet by mouth daily.  Dispense: 90 tablet; Refill: 1  2. Hyperlipidemia with target LDL less than 100 Low fat diet - fenofibrate  (TRICOR ) 145 MG tablet; Take 1 tablet (145 mg total) by mouth daily.  Dispense: 90 tablet; Refill: 1  3. Chronic kidney disease (CKD) stage G3a/A1, moderately decreased glomerular filtration rate (GFR) between 45-59 mL/min/1.73 square meter and albuminuria creatinine ratio less than 30 mg/g [facility]) Labs pending' keep follow up with nephrology  4. Bradycardia Report any syncopal or near syncopal episodes  5. Metabolic syndrome Watch carbs in diet - Bayer DCA Hb A1c Waived - Microalbumin / creatinine urine ratio  6. Morbid obesity (HCC) Discussed diet and exercise for person with BMI >25 Will recheck weight in 3-6 months   Labs pending Health Maintenance reviewed Diet and exercise encouraged  Follow up plan: 6 months   Mary-Margaret Gladis, FNP

## 2023-10-07 LAB — CMP14+EGFR
ALT: 24 IU/L (ref 0–44)
AST: 30 IU/L (ref 0–40)
Albumin: 4.3 g/dL (ref 3.8–4.9)
Alkaline Phosphatase: 35 IU/L — AB (ref 44–121)
BUN/Creatinine Ratio: 15 (ref 9–20)
BUN: 40 mg/dL — AB (ref 6–24)
Bilirubin Total: 0.3 mg/dL (ref 0.0–1.2)
CO2: 18 mmol/L — AB (ref 20–29)
Calcium: 9.5 mg/dL (ref 8.7–10.2)
Chloride: 108 mmol/L — AB (ref 96–106)
Creatinine, Ser: 2.61 mg/dL — AB (ref 0.76–1.27)
Globulin, Total: 2.1 g/dL (ref 1.5–4.5)
Glucose: 100 mg/dL — AB (ref 70–99)
Potassium: 4.7 mmol/L (ref 3.5–5.2)
Sodium: 139 mmol/L (ref 134–144)
Total Protein: 6.4 g/dL (ref 6.0–8.5)
eGFR: 28 mL/min/1.73 — AB (ref >59)

## 2023-10-07 LAB — LIPID PANEL
Cholesterol, Total: 151 mg/dL (ref 100–199)
HDL: 29 mg/dL — AB (ref >39)
LDL CALC COMMENT:: 5.2 ratio — AB (ref 0.0–5.0)
LDL Chol Calc (NIH): 80 mg/dL (ref 0–99)
Triglycerides: 250 mg/dL — AB (ref 0–149)
VLDL Cholesterol Cal: 42 mg/dL — AB (ref 5–40)

## 2023-10-07 LAB — CBC WITH DIFFERENTIAL/PLATELET
Basophils Absolute: 0 x10E3/uL (ref 0.0–0.2)
Basos: 0 %
EOS (ABSOLUTE): 0.1 x10E3/uL (ref 0.0–0.4)
Eos: 3 %
Hematocrit: 39.9 % (ref 37.5–51.0)
Hemoglobin: 13.3 g/dL (ref 13.0–17.7)
Immature Grans (Abs): 0 x10E3/uL (ref 0.0–0.1)
Immature Granulocytes: 0 %
Lymphocytes Absolute: 1.8 x10E3/uL (ref 0.7–3.1)
Lymphs: 38 %
MCH: 30.4 pg (ref 26.6–33.0)
MCHC: 33.3 g/dL (ref 31.5–35.7)
MCV: 91 fL (ref 79–97)
Monocytes Absolute: 0.5 x10E3/uL (ref 0.1–0.9)
Monocytes: 11 %
Neutrophils Absolute: 2.3 x10E3/uL (ref 1.4–7.0)
Neutrophils: 48 %
Platelets: 238 x10E3/uL (ref 150–450)
RBC: 4.37 x10E6/uL (ref 4.14–5.80)
RDW: 14.2 % (ref 11.6–15.4)
WBC: 4.7 x10E3/uL (ref 3.4–10.8)

## 2023-10-08 ENCOUNTER — Ambulatory Visit: Payer: Self-pay | Admitting: Nurse Practitioner

## 2023-10-14 ENCOUNTER — Ambulatory Visit: Payer: Self-pay | Admitting: Adult Health

## 2023-10-14 ENCOUNTER — Ambulatory Visit (HOSPITAL_COMMUNITY)
Admission: RE | Admit: 2023-10-14 | Discharge: 2023-10-14 | Disposition: A | Discharge status: Home / Self Care | Payer: BC Managed Care – PPO | Source: Ambulatory Visit | Attending: Cardiovascular Disease | Admitting: Cardiovascular Disease

## 2023-10-14 DIAGNOSIS — I2510 Atherosclerotic heart disease of native coronary artery without angina pectoris: Secondary | ICD-10-CM | POA: Insufficient documentation

## 2023-10-14 DIAGNOSIS — I5021 Acute systolic (congestive) heart failure: Secondary | ICD-10-CM

## 2023-10-14 LAB — ECHOCARDIOGRAM COMPLETE
Area-P 1/2: 2.5 cm2
S' Lateral: 4.3 cm

## 2023-12-22 ENCOUNTER — Other Ambulatory Visit

## 2023-12-22 DIAGNOSIS — D631 Anemia in chronic kidney disease: Secondary | ICD-10-CM | POA: Diagnosis not present

## 2023-12-22 DIAGNOSIS — E211 Secondary hyperparathyroidism, not elsewhere classified: Secondary | ICD-10-CM | POA: Diagnosis not present

## 2023-12-22 DIAGNOSIS — R809 Proteinuria, unspecified: Secondary | ICD-10-CM | POA: Diagnosis not present

## 2023-12-22 DIAGNOSIS — N189 Chronic kidney disease, unspecified: Secondary | ICD-10-CM | POA: Diagnosis not present

## 2024-01-05 DIAGNOSIS — R808 Other proteinuria: Secondary | ICD-10-CM | POA: Diagnosis not present

## 2024-01-05 DIAGNOSIS — N184 Chronic kidney disease, stage 4 (severe): Secondary | ICD-10-CM | POA: Diagnosis not present

## 2024-01-05 DIAGNOSIS — I5042 Chronic combined systolic (congestive) and diastolic (congestive) heart failure: Secondary | ICD-10-CM | POA: Diagnosis not present

## 2024-01-05 DIAGNOSIS — E1122 Type 2 diabetes mellitus with diabetic chronic kidney disease: Secondary | ICD-10-CM | POA: Diagnosis not present

## 2024-02-16 ENCOUNTER — Other Ambulatory Visit

## 2024-02-18 ENCOUNTER — Other Ambulatory Visit

## 2024-02-18 DIAGNOSIS — R809 Proteinuria, unspecified: Secondary | ICD-10-CM | POA: Diagnosis not present

## 2024-02-18 DIAGNOSIS — N189 Chronic kidney disease, unspecified: Secondary | ICD-10-CM | POA: Diagnosis not present

## 2024-02-18 DIAGNOSIS — D631 Anemia in chronic kidney disease: Secondary | ICD-10-CM | POA: Diagnosis not present

## 2024-03-01 DIAGNOSIS — N184 Chronic kidney disease, stage 4 (severe): Secondary | ICD-10-CM | POA: Diagnosis not present

## 2024-03-01 DIAGNOSIS — N2581 Secondary hyperparathyroidism of renal origin: Secondary | ICD-10-CM | POA: Diagnosis not present

## 2024-03-01 DIAGNOSIS — E1122 Type 2 diabetes mellitus with diabetic chronic kidney disease: Secondary | ICD-10-CM | POA: Diagnosis not present

## 2024-03-01 DIAGNOSIS — R808 Other proteinuria: Secondary | ICD-10-CM | POA: Diagnosis not present

## 2024-03-20 ENCOUNTER — Other Ambulatory Visit: Payer: Self-pay | Admitting: Nurse Practitioner

## 2024-04-04 ENCOUNTER — Ambulatory Visit: Payer: Self-pay | Admitting: Nurse Practitioner

## 2024-04-07 ENCOUNTER — Ambulatory Visit: Admitting: Nurse Practitioner

## 2024-04-07 ENCOUNTER — Encounter: Payer: Self-pay | Admitting: Nurse Practitioner

## 2024-04-07 VITALS — BP 131/84 | HR 60 | Temp 98.1°F | Ht 73.0 in | Wt 321.0 lb

## 2024-04-07 DIAGNOSIS — Z125 Encounter for screening for malignant neoplasm of prostate: Secondary | ICD-10-CM | POA: Diagnosis not present

## 2024-04-07 DIAGNOSIS — Z6841 Body Mass Index (BMI) 40.0 and over, adult: Secondary | ICD-10-CM

## 2024-04-07 DIAGNOSIS — R001 Bradycardia, unspecified: Secondary | ICD-10-CM | POA: Diagnosis not present

## 2024-04-07 DIAGNOSIS — I10 Essential (primary) hypertension: Secondary | ICD-10-CM

## 2024-04-07 DIAGNOSIS — N1831 Chronic kidney disease, stage 3a: Secondary | ICD-10-CM | POA: Diagnosis not present

## 2024-04-07 DIAGNOSIS — E8881 Metabolic syndrome: Secondary | ICD-10-CM

## 2024-04-07 DIAGNOSIS — E785 Hyperlipidemia, unspecified: Secondary | ICD-10-CM

## 2024-04-07 LAB — CBC WITH DIFFERENTIAL/PLATELET
Basophils Absolute: 0 10*3/uL (ref 0.0–0.2)
Basos: 1 %
EOS (ABSOLUTE): 0.1 10*3/uL (ref 0.0–0.4)
Eos: 2 %
Hematocrit: 41.1 % (ref 37.5–51.0)
Hemoglobin: 13.3 g/dL (ref 13.0–17.7)
Immature Grans (Abs): 0 10*3/uL (ref 0.0–0.1)
Immature Granulocytes: 0 %
Lymphocytes Absolute: 1.9 10*3/uL (ref 0.7–3.1)
Lymphs: 34 %
MCH: 30 pg (ref 26.6–33.0)
MCHC: 32.4 g/dL (ref 31.5–35.7)
MCV: 93 fL (ref 79–97)
Monocytes Absolute: 0.6 10*3/uL (ref 0.1–0.9)
Monocytes: 11 %
Neutrophils Absolute: 2.9 10*3/uL (ref 1.4–7.0)
Neutrophils: 52 %
Platelets: 249 10*3/uL (ref 150–450)
RBC: 4.43 x10E6/uL (ref 4.14–5.80)
RDW: 14.3 % (ref 11.6–15.4)
WBC: 5.6 10*3/uL (ref 3.4–10.8)

## 2024-04-07 LAB — CMP14+EGFR
ALT: 28 [IU]/L (ref 0–44)
AST: 35 [IU]/L (ref 0–40)
Albumin: 4.2 g/dL (ref 3.8–4.9)
Alkaline Phosphatase: 40 [IU]/L — ABNORMAL LOW (ref 47–123)
BUN/Creatinine Ratio: 15 (ref 9–20)
BUN: 45 mg/dL — ABNORMAL HIGH (ref 6–24)
Bilirubin Total: 0.3 mg/dL (ref 0.0–1.2)
CO2: 19 mmol/L — ABNORMAL LOW (ref 20–29)
Calcium: 9.4 mg/dL (ref 8.7–10.2)
Chloride: 105 mmol/L (ref 96–106)
Creatinine, Ser: 3.05 mg/dL (ref 0.76–1.27)
Globulin, Total: 2.4 g/dL (ref 1.5–4.5)
Glucose: 111 mg/dL — ABNORMAL HIGH (ref 70–99)
Potassium: 5.1 mmol/L (ref 3.5–5.2)
Sodium: 140 mmol/L (ref 134–144)
Total Protein: 6.6 g/dL (ref 6.0–8.5)
eGFR: 23 mL/min/{1.73_m2} — ABNORMAL LOW

## 2024-04-07 LAB — LIPID PANEL
Chol/HDL Ratio: 5.7 ratio — ABNORMAL HIGH (ref 0.0–5.0)
Cholesterol, Total: 165 mg/dL (ref 100–199)
HDL: 29 mg/dL — ABNORMAL LOW
LDL Chol Calc (NIH): 88 mg/dL (ref 0–99)
Triglycerides: 290 mg/dL — ABNORMAL HIGH (ref 0–149)
VLDL Cholesterol Cal: 48 mg/dL — ABNORMAL HIGH (ref 5–40)

## 2024-04-07 LAB — BAYER DCA HB A1C WAIVED: HB A1C (BAYER DCA - WAIVED): 6 % — ABNORMAL HIGH (ref 4.8–5.6)

## 2024-04-07 MED ORDER — METOPROLOL SUCCINATE ER 100 MG PO TB24: ORAL_TABLET | ORAL | 1 refills | Status: AC

## 2024-04-07 MED ORDER — ROSUVASTATIN CALCIUM 40 MG PO TABS: 40.0000 mg | ORAL_TABLET | Freq: Every day | ORAL | 1 refills | Status: AC

## 2024-04-07 MED ORDER — SPIRONOLACTONE 25 MG PO TABS: 12.5000 mg | ORAL_TABLET | Freq: Every day | ORAL | 1 refills | Status: AC

## 2024-04-07 MED ORDER — AMLODIPINE BESYLATE 10 MG PO TABS: 10.0000 mg | ORAL_TABLET | Freq: Every day | ORAL | 1 refills | Status: AC

## 2024-04-07 MED ORDER — FENOFIBRATE 145 MG PO TABS: 145.0000 mg | ORAL_TABLET | Freq: Every day | ORAL | 1 refills | Status: AC

## 2024-04-07 NOTE — Patient Instructions (Signed)

## 2024-04-07 NOTE — Progress Notes (Signed)
 "  Subjective:    Patient ID: Ian Stewart, male    DOB: 21-May-1970, 54 y.o.   MRN: [id number]   Chief Complaint: medical management of chronic issues     HPI:  Ian Stewart is a 54 y.o. who identifies as a male who was assigned male at birth.   Social history: Lives with: by hisself Work history: [employer]   Comes in today for follow up of the following chronic medical issues:  1. Essential hypertension No c/o chest pain, sob or headache. Does not check blood pressure at home. BP Readings from Last 3 Encounters:  10/06/23 128/84  09/22/23 122/73  09/16/23 125/84     2. Hyperlipidemia with target LDL less than 100 Does not watch diet and does no dedicated exercise. Lab Results  Component Value Date   CHOL 151 10/06/2023   HDL 29 (L) 10/06/2023   LDLCALC 80 10/06/2023   TRIG 250 (H) 10/06/2023   CHOLHDL 5.2 (H) 10/06/2023     3. Chronic kidney disease (CKD) stage G3a/A1, moderately decreased glomerular filtration rate (GFR) between 45-59 mL/min/1.73 square meter and albuminuria creatinine ratio less than 30 mg/g (HCC) Sees nephrology every 3 months. Has follow up in March. Lab Results  Component Value Date   CREATININE 2.61 (H) 10/06/2023     4. Bradycardia No syncopal or near syncopal episodes.   5. Metabolic syndrome Does not check blood sugars at home. Lab Results  Component Value Date   HGBA1C 5.7 (H) 10/06/2023     6. Morbid obesity (HCC) No recent weight changes BMI Readings from Last 3 Encounters:  04/07/24 42.35 kg/m  10/06/23 40.24 kg/m  09/16/23 40.90 kg/m   Wt Readings from Last 3 Encounters:  04/07/24 (!) 321 lb (145.6 kg)  10/06/23 (!) 305 lb (138.3 kg)  09/16/23 (!) 310 lb (140.6 kg)        New complaints: None today  Allergies  Allergen Reactions   Lipitor [Atorvastatin] Other (See Comments)    Muscle aches   Outpatient Encounter Medications as of 04/07/2024  Medication Sig   acetaminophen  (TYLENOL ) 500 MG tablet Take  1,000 mg by mouth every 6 (six) hours as needed for mild pain or moderate pain.   amLODipine  (NORVASC ) 10 MG tablet Take 1 tablet (10 mg total) by mouth daily.   Ascorbic Acid (VITAMIN C) 500 MG CAPS Take by mouth.   aspirin  81 MG tablet Take 81 mg by mouth daily.   Cholecalciferol (DIALYVITE VITAMIN D 5000 PO) Take by mouth.   Coenzyme Q10 (CO Q 10 PO) Take by mouth.   colchicine  0.6 MG tablet 2 tablets PO at gout onswet, may repeat 1x in 1 hour. No more then 3 per day.   FARXIGA 5 MG TABS tablet Take 5 mg by mouth daily.   fenofibrate  (TRICOR ) 145 MG tablet Take 1 tablet (145 mg total) by mouth daily.   fluticasone (FLONASE) 50 MCG/ACT nasal spray Place 2 sprays into both nostrils every morning.    metoprolol  succinate (TOPROL -XL) 100 MG 24 hr tablet TAKE 1 TABLET BY MOUTH DAILY  TAKE WITH OR IMMEDIATELY  FOLLOWING A MEAL IN THE MORNING   Multiple Vitamin (MULTIVITAMIN) capsule Take 1 capsule by mouth every morning.    Multiple Vitamins-Minerals (ZINC PO) Take by mouth.   rosuvastatin  (CRESTOR ) 40 MG tablet Take 1 tablet (40 mg total) by mouth daily.   sacubitril -valsartan  (ENTRESTO ) 97-103 MG TAKE 1 TABLET BY MOUTH TWICE  DAILY   spironolactone  (ALDACTONE ) 25 MG tablet Take  0.5 tablets (12.5 mg total) by mouth daily.   No facility-administered encounter medications on file as of 04/07/2024.    Past Surgical History:  Procedure Laterality Date   RETINAL DETACHMENT SURGERY      Family History  Problem Relation Age of Onset   CAD Mother 6   CAD Father 66       CABG, died age 66   Colon cancer Neg Hx    Esophageal cancer Neg Hx    Stomach cancer Neg Hx    Rectal cancer Neg Hx       Controlled substance contract: n/a     Review of Systems  Constitutional:  Negative for diaphoresis.  Eyes:  Negative for pain.  Respiratory:  Negative for shortness of breath.   Cardiovascular:  Negative for chest pain, palpitations and leg swelling.  Gastrointestinal:  Negative for  abdominal pain.  Endocrine: Negative for polydipsia.  Skin:  Negative for rash.  Neurological:  Negative for dizziness, weakness and headaches.  Hematological:  Does not bruise/bleed easily.  All other systems reviewed and are negative.      Objective:   Physical Exam Vitals and nursing note reviewed.  Constitutional:      Appearance: Normal appearance. He is well-developed.  HENT:     Head: Normocephalic.     Nose: Nose normal.     Mouth/Throat:     Mouth: Mucous membranes are moist.     Pharynx: Oropharynx is clear.  Eyes:     Pupils: Pupils are equal, round, and reactive to light.  Neck:     Thyroid : No thyroid  mass or thyromegaly.     Vascular: No carotid bruit or JVD.     Trachea: Phonation normal.  Cardiovascular:     Rate and Rhythm: Normal rate and regular rhythm.  Pulmonary:     Effort: Pulmonary effort is normal. No respiratory distress.     Breath sounds: Normal breath sounds.  Abdominal:     General: Bowel sounds are normal.     Palpations: Abdomen is soft.     Tenderness: There is no abdominal tenderness.  Musculoskeletal:        General: Normal range of motion.     Cervical back: Normal range of motion and neck supple.  Lymphadenopathy:     Cervical: No cervical adenopathy.  Skin:    General: Skin is warm and dry.  Neurological:     Mental Status: He is alert and oriented to person, place, and time.  Psychiatric:        Behavior: Behavior normal.        Thought Content: Thought content normal.        Judgment: Judgment normal.    BP 131/84   Pulse 60   Temp 98.1 F (36.7 C) (Temporal)   Ht 6' 1 (1.854 m)   Wt (!) 321 lb (145.6 kg)   SpO2 97%   BMI 42.35 kg/m     Hgba1c 6.0%      Assessment & Plan:  Ian Stewart comes in today with chief complaint of medical management of chronic issues    Diagnosis and orders addressed:  1. Essential hypertension Low sodium diet - CBC with Differential/Platelet - CMP14+EGFR Continue entresto  as  ordered - amLODipine  (NORVASC ) 10 MG tablet; Take 1 tablet (10 mg total) by mouth daily.  Dispense: 90 tablet; Refill: 1 - metoprolol  succinate (TOPROL -XL) 100 MG 24 hr tablet; TAKE 1 TABLET BY MOUTH DAILY  TAKE WITH OR IMMEDIATELY  FOLLOWING A  MEAL IN THE MORNING  Dispense: 90 tablet; Refill: 1 - valsartan -hydrochlorothiazide  (DIOVAN -HCT) 320-25 MG tablet; Take 1 tablet by mouth daily.  Dispense: 90 tablet; Refill: 1  2. Hyperlipidemia with target LDL less than 100 Low fat diet - fenofibrate  (TRICOR ) 145 MG tablet; Take 1 tablet (145 mg total) by mouth daily.  Dispense: 90 tablet; Refill: 1  3. Chronic kidney disease (CKD) stage G3a/A1, moderately decreased glomerular filtration rate (GFR) between 45-59 mL/min/1.73 square meter and albuminuria creatinine ratio less than 30 mg/g [facility]) Labs pending' keep follow up with nephrology  4. Bradycardia Report any syncopal or near syncopal episodes  5. Metabolic syndrome Watch carbs in diet - Bayer DCA Hb A1c Waived - Microalbumin / creatinine urine ratio  6. Morbid obesity (HCC) Discussed diet and exercise for person with BMI >25 Will recheck weight in 3-6 months   Labs pending Health Maintenance reviewed Diet and exercise encouraged  Follow up plan: 6 months   Mary-Margaret Gladis, FNP   "

## 2024-04-08 ENCOUNTER — Ambulatory Visit: Payer: Self-pay | Admitting: Nurse Practitioner

## 2024-04-09 LAB — SPECIMEN STATUS REPORT

## 2024-04-09 LAB — PSA: Prostate Specific Ag, Serum: 0.4 ng/mL (ref 0.0–4.0)

## 2024-04-11 ENCOUNTER — Ambulatory Visit: Admitting: Cardiology

## 2024-05-18 ENCOUNTER — Ambulatory Visit

## 2024-05-18 ENCOUNTER — Other Ambulatory Visit

## 2024-06-14 ENCOUNTER — Ambulatory Visit: Admitting: Cardiology

## 2024-10-06 ENCOUNTER — Ambulatory Visit: Admitting: Nurse Practitioner
